# Patient Record
Sex: Male | Born: 1995 | Race: Black or African American | Hispanic: No | Marital: Single | Smoking: Current every day smoker
Health system: Southern US, Community
[De-identification: ages and names within clinical notes are randomized; demographics above are authoritative.]

## PROBLEM LIST (undated history)

## (undated) HISTORY — PX: TONSILLECTOMY: SUR1361

## (undated) HISTORY — PX: ADENOIDECTOMY: SUR15

---
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
## 2016-03-29 IMAGING — CR DG CHEST 2V
2 series · 2 of 2 positions shown · non-contrast
Comparison: None

CLINICAL DATA: Cough, congestion and chills.

EXAM:
CHEST - 2 VIEW

[chest pa]
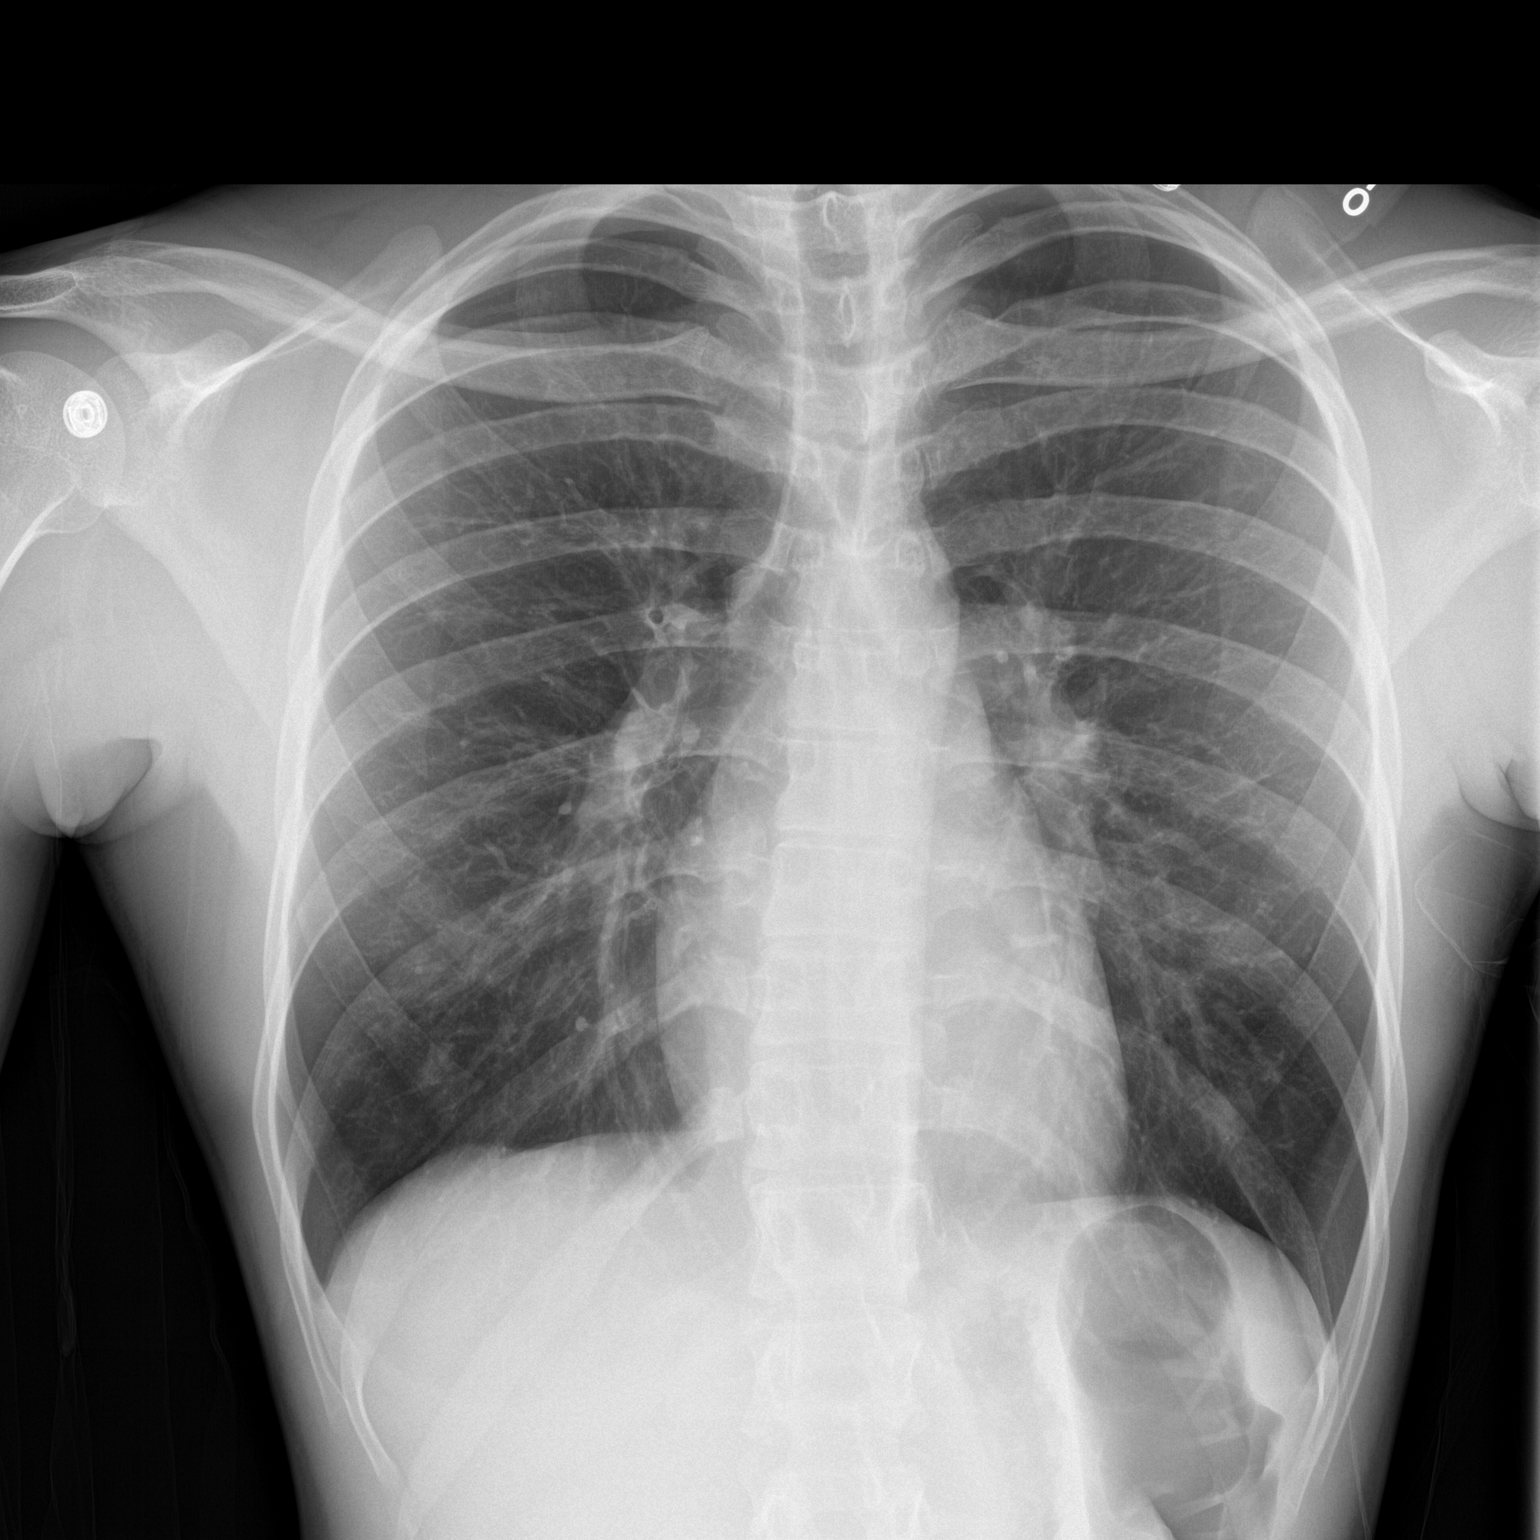

[chest lat]
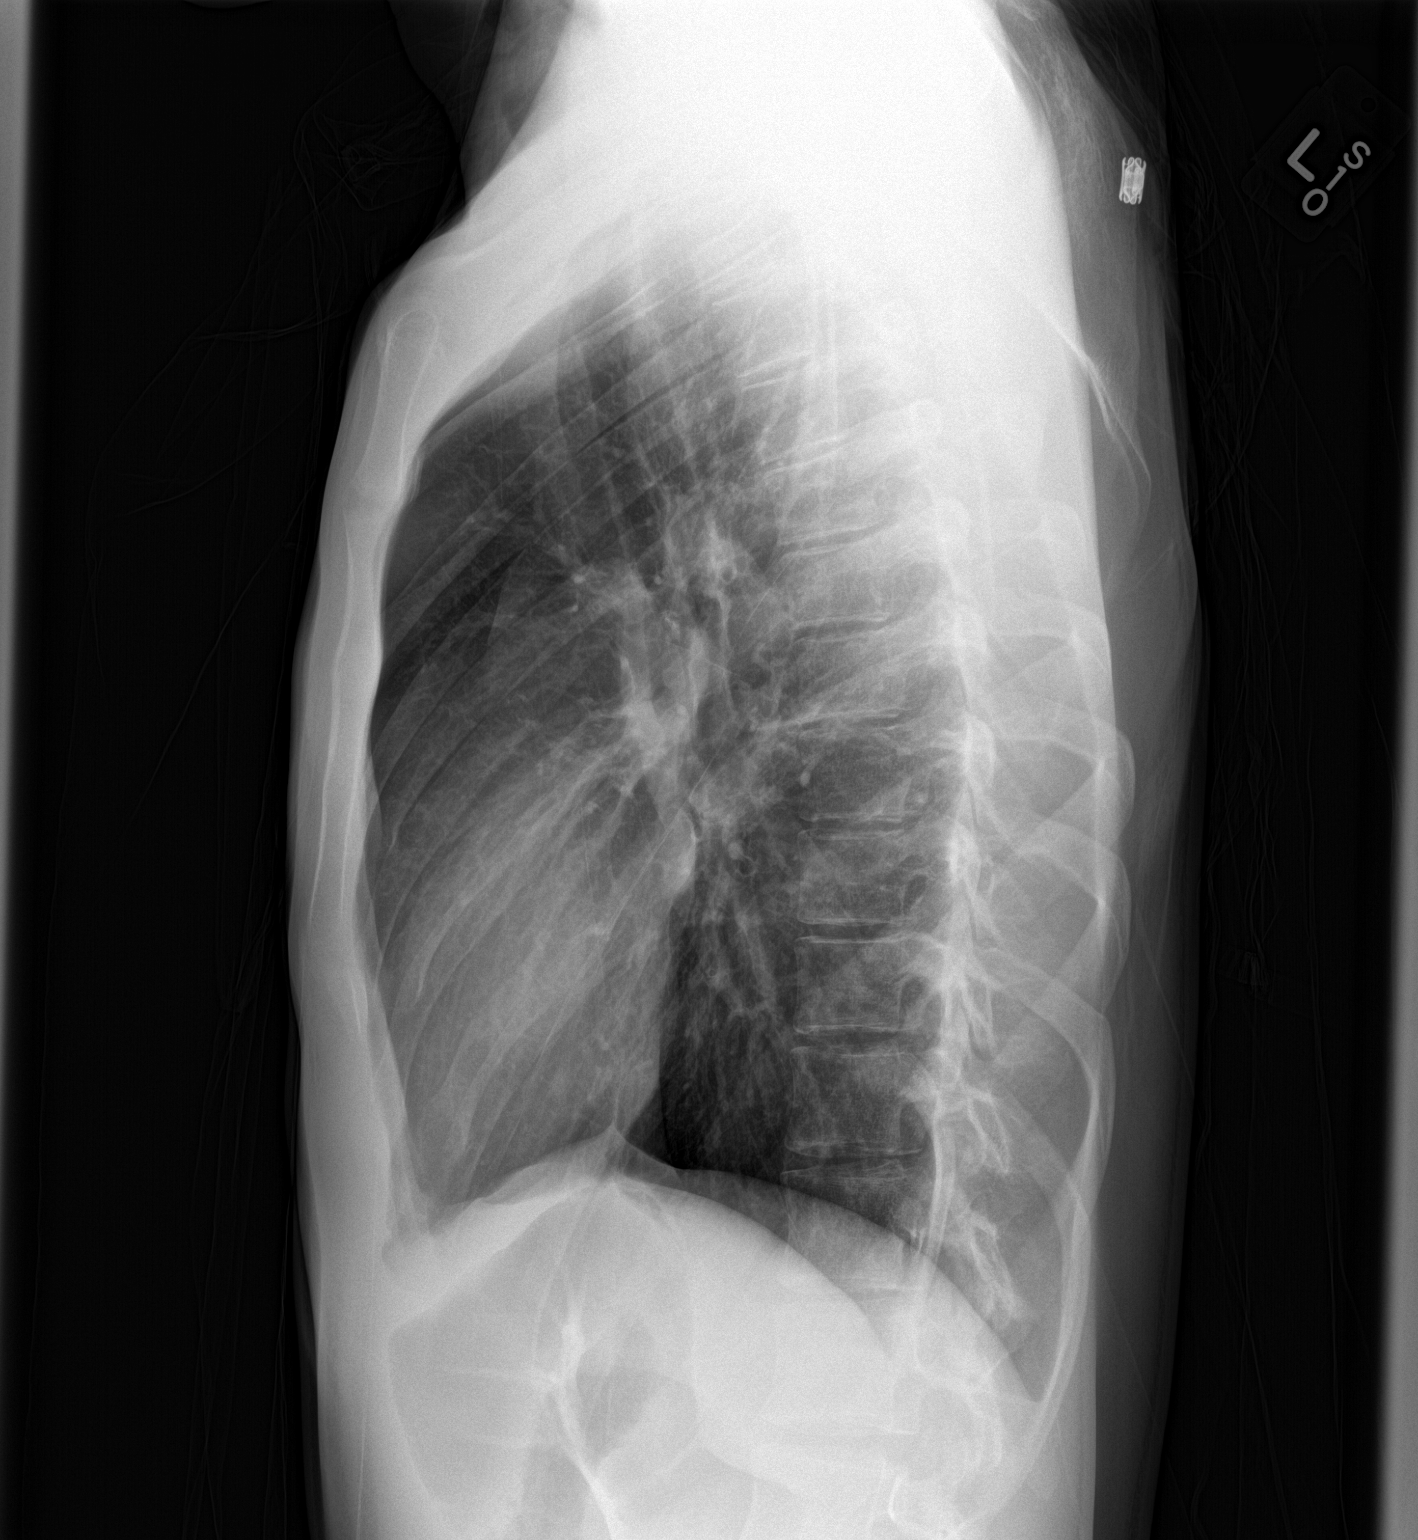

[2 of 2 positions shown; findings below may reference images not displayed]

FINDINGS: The heart size and mediastinal contours are within normal limits.
There is no evidence of pulmonary edema, consolidation,
pneumothorax, nodule or pleural fluid. The visualized skeletal
structures are unremarkable.
IMPRESSION: No active disease.

## 2016-11-03 ENCOUNTER — Emergency Department (HOSPITAL_COMMUNITY)
Admission: EM | Admit: 2016-11-03 | Discharge: 2016-11-03 | Disposition: A | Discharge status: Home / Self Care | Payer: 59 | Attending: Emergency Medicine | Admitting: Emergency Medicine

## 2016-11-03 ENCOUNTER — Encounter (HOSPITAL_COMMUNITY): Payer: Self-pay | Admitting: Emergency Medicine

## 2016-11-03 DIAGNOSIS — K0889 Other specified disorders of teeth and supporting structures: Secondary | ICD-10-CM | POA: Diagnosis present

## 2016-11-03 DIAGNOSIS — K029 Dental caries, unspecified: Secondary | ICD-10-CM | POA: Diagnosis not present

## 2016-11-03 DIAGNOSIS — F17200 Nicotine dependence, unspecified, uncomplicated: Secondary | ICD-10-CM | POA: Diagnosis not present

## 2016-11-03 DIAGNOSIS — Z79899 Other long term (current) drug therapy: Secondary | ICD-10-CM | POA: Insufficient documentation

## 2016-11-03 MED ORDER — PENICILLIN V POTASSIUM 250 MG PO TABS
500.0000 mg | ORAL_TABLET | Freq: Once | ORAL | Status: AC
  Administered 2016-11-03: 500 mg via ORAL
  Filled 2016-11-03: qty 2

## 2016-11-03 MED ORDER — ACETAMINOPHEN 325 MG PO TABS
650.0000 mg | ORAL_TABLET | Freq: Once | ORAL | Status: AC
  Administered 2016-11-03: 650 mg via ORAL
  Filled 2016-11-03: qty 2

## 2016-11-03 MED ORDER — IBUPROFEN 800 MG PO TABS: 800.0000 mg | ORAL_TABLET | Freq: Three times a day (TID) | ORAL | 0 refills | Status: AC

## 2016-11-03 MED ORDER — PENICILLIN V POTASSIUM 500 MG PO TABS: 500.0000 mg | ORAL_TABLET | Freq: Three times a day (TID) | ORAL | 0 refills | Status: AC

## 2016-11-03 NOTE — Discharge Instructions (Signed)
Please read and follow all provided instructions.  Your diagnoses today include:  1. Toothache     The exam and treatment you received today has been provided on an emergency basis only. This is not a substitute for complete medical or dental care.  Tests performed today include:  Vital signs. See below for your results today.   Medications prescribed:   Penicillin - antibiotic  You have been prescribed an antibiotic medicine: take the entire course of medicine even if you are feeling better. Stopping early can cause the antibiotic not to work.   Ibuprofen (Motrin, Advil) - anti-inflammatory pain medication  Do not exceed  ibuprofen every 6 hours, take with food  You have been prescribed an anti-inflammatory medication or NSAID. Take with food. Take smallest effective dose for the shortest duration needed for your pain. Stop taking if you experience stomach pain or vomiting.   Take any prescribed medications only as directed.  Home care instructions:  Follow any educational materials contained in this packet.  Follow-up instructions: Please follow-up with your dentist for further evaluation of your symptoms.   Dental Assistance: See attached dental referrals  Return instructions:   Please return to the Emergency Department if you experience worsening symptoms.  Please return if you develop a fever, you develop more swelling in your face or neck, you have trouble breathing or swallowing food.  Please return if you have any other emergent concerns.  Additional Information:  Your vital signs today were: BP 111/67 (BP Location: Left Arm)    Pulse 65    Temp 98.2 F (36.8 C) (Oral)    Resp 14    Ht  (1.753 m)    Wt 63.5 kg    SpO2 98%    BMI 20.67 kg/m  If your blood pressure (BP) was elevated above 135/85 this visit, please have this repeated by your doctor within one month. --------------

## 2016-11-03 NOTE — ED Provider Notes (Signed)
[facility]-EMERGENCY DEPT Provider Note   CSN: [id number] Arrival date & time: 11/03/16  2216     History   Chief Complaint Chief Complaint  Patient presents with  . Dental Pain    HPI Jared Howell is a 21 y.o. male.  Patient presents with complaint of dental pain ongoing intermittently for the past several months, worsening today. Patient describes having pain in her left lower molar where he has a large cavity. Wisdom teeth have not yet erupted. He has reported some subjective swelling to left lower jaw. No neck swelling, difficulty breathing, or swallowing. He has been taking Aleve with some relief. No fevers. Patient does not currently have a dentist.      History reviewed. No pertinent past medical history.  There are no active problems to display for this patient.   History reviewed. No pertinent surgical history.     Home Medications    Prior to Admission medications   Medication Sig Start Date End Date Taking? Authorizing Provider  azithromycin (ZITHROMAX) 250 MG tablet Take 1 tablet (250 mg total) by mouth daily. Take first 2 tablets together, then 1 every day until finished. 09/27/14   Elson Areas, PA-C  ibuprofen (ADVIL,MOTRIN) 800 MG tablet Take 1 tablet (800 mg total) by mouth 3 (three) times daily. 11/03/16   Renne Crigler, PA-C  ipratropium (ATROVENT) 0.06 % nasal spray Place 2 sprays into both nostrils 4 (four) times daily. 09/25/14   Rodolph Bong, MD  naproxen (NAPROSYN) 375 MG tablet Take 1 tablet (375 mg total) by mouth 2 (two) times daily. 09/25/14   Rodolph Bong, MD  omeprazole (PRILOSEC) 40 MG capsule Take 1 capsule (40 mg total) by mouth daily. 09/25/14   Rodolph Bong, MD  penicillin v potassium (VEETID) 500 MG tablet Take 1 tablet (500 mg total) by mouth 3 (three) times daily. 11/03/16   Renne Crigler, PA-C    Family History No family history on file.  Social History Social History  Substance Use Topics  . Smoking status: Current Every Day Smoker  .  Smokeless tobacco: Never Used  . Alcohol use No     Allergies   Patient has no known allergies.   Review of Systems Review of Systems  Constitutional: Negative for fever.  HENT: Positive for dental problem and facial swelling. Negative for ear pain, sore throat and trouble swallowing.   Respiratory: Negative for shortness of breath and stridor.   Musculoskeletal: Negative for neck pain.  Skin: Negative for color change.  Neurological: Negative for headaches.     Physical Exam Updated Vital Signs BP 113/79 (BP Location: Right Arm)   Pulse 61   Temp 98.2 F (36.8 C) (Oral)   Resp 18   Ht  (1.753 m)   Wt 63.5 kg   SpO2 98%   BMI 20.67 kg/m   Physical Exam  Constitutional: He appears well-developed and well-nourished.  HENT:  Head: Normocephalic and atraumatic.  Right Ear: Tympanic membrane, external ear and ear canal normal.  Left Ear: Tympanic membrane, external ear and ear canal normal.  Nose: Nose normal.  Mouth/Throat: Uvula is midline, oropharynx is clear and moist and mucous membranes are normal. No trismus in the jaw. Abnormal dentition. Dental caries present. No dental abscesses or uvula swelling. No tonsillar abscesses.  Tooth 15 with a large cavity. Patient with some gingival tenderness in this area. No palpable or first abscess noted. No swelling or erythema noted on exam.  Eyes: Pupils are equal, round,  and reactive to light.  Neck: Normal range of motion. Neck supple.  No neck swelling or Lugwig's angina  Neurological: He is alert.  Skin: Skin is warm and dry.  Psychiatric: He has a normal mood and affect.  Nursing note and vitals reviewed.    ED Treatments / Results   Procedures Procedures (including critical care time)  Medications Ordered in ED Medications  penicillin v potassium (VEETID) tablet 500 mg (500 mg Oral Given 11/03/16 2336)  acetaminophen (TYLENOL) tablet 650 mg (650 mg Oral Given 11/03/16 2336)     Initial Impression /  Assessment and Plan / ED Course  I have reviewed the triage vital signs and the nursing notes.  Pertinent labs & imaging results that were available during my care of the patient were reviewed by me and considered in my medical decision making (see chart for details).     11:54 PM Patient seen and examined. Medications ordered.   Vital signs reviewed and are as follows: BP 113/79 (BP Location: Right Arm)   Pulse 61   Temp 98.2 F (36.8 C) (Oral)   Resp 18   Ht  (1.753 m)   Wt 63.5 kg   SpO2 98%   BMI 20.67 kg/m   Patient counseled to take prescribed medications as directed, return with worsening facial or neck swelling, and to follow-up with their dentist as soon as possible.    Final Clinical Impressions(s) / ED Diagnoses   Final diagnoses:  Toothache   Patient with toothache. No fever. Exam unconcerning for Ludwig's angina or other deep tissue infection in neck.   As there is gum swelling and increasing pain will treat with antibiotic and pain medicine. Urged patient to follow-up with dentist. Referrals given.   New Prescriptions Discharge Medication List as of 11/03/2016 11:27 PM    START taking these medications   Details  ibuprofen (ADVIL,MOTRIN) 800 MG tablet Take 1 tablet (800 mg total) by mouth 3 (three) times daily., Starting Sun 11/03/2016, Print    penicillin v potassium (VEETID) 500 MG tablet Take 1 tablet (500 mg total) by mouth 3 (three) times daily., Starting Sun 11/03/2016, Print         Renne Crigler, PA-C 11/03/16 2355    Samuel Jester, DO 11/05/16 2007

## 2016-11-03 NOTE — ED Notes (Signed)
Keyboard in room not working

## 2016-11-03 NOTE — ED Triage Notes (Signed)
C/o L sided upper and lower dental pain x 1 month.  Rates pain 10/10.  Pt eating a Rice Krispie treat in triage.

## 2017-03-28 DIAGNOSIS — F902 Attention-deficit hyperactivity disorder, combined type: Secondary | ICD-10-CM | POA: Insufficient documentation

## 2017-06-03 ENCOUNTER — Other Ambulatory Visit (INDEPENDENT_AMBULATORY_CARE_PROVIDER_SITE_OTHER): Payer: Self-pay | Admitting: Family Medicine

## 2017-10-22 ENCOUNTER — Other Ambulatory Visit (INDEPENDENT_AMBULATORY_CARE_PROVIDER_SITE_OTHER): Payer: Self-pay | Admitting: Family Medicine

## 2018-04-30 ENCOUNTER — Other Ambulatory Visit: Payer: No Typology Code available for payment source

## 2018-04-30 ENCOUNTER — Inpatient Hospital Stay: Payer: No Typology Code available for payment source | Admitting: Certified Registered"

## 2018-04-30 ENCOUNTER — Emergency Department: Payer: No Typology Code available for payment source

## 2018-04-30 ENCOUNTER — Inpatient Hospital Stay
Admission: AC | Admit: 2018-04-30 | Discharge: 2018-05-19 | DRG: 326 | Payer: No Typology Code available for payment source | Attending: Surgery | Admitting: Surgery

## 2018-04-30 ENCOUNTER — Encounter: Admission: EM | Payer: Self-pay | Source: Home / Self Care | Attending: Surgery

## 2018-04-30 ENCOUNTER — Inpatient Hospital Stay: Payer: No Typology Code available for payment source

## 2018-04-30 DIAGNOSIS — N3289 Other specified disorders of bladder: Secondary | ICD-10-CM | POA: Diagnosis not present

## 2018-04-30 DIAGNOSIS — S36220A Contusion of head of pancreas, initial encounter: Secondary | ICD-10-CM | POA: Diagnosis present

## 2018-04-30 DIAGNOSIS — S36113A Laceration of liver, unspecified degree, initial encounter: Secondary | ICD-10-CM

## 2018-04-30 DIAGNOSIS — S12500A Unspecified displaced fracture of sixth cervical vertebra, initial encounter for closed fracture: Secondary | ICD-10-CM | POA: Diagnosis present

## 2018-04-30 DIAGNOSIS — S51851A Open bite of right forearm, initial encounter: Secondary | ICD-10-CM | POA: Diagnosis present

## 2018-04-30 DIAGNOSIS — S36116A Major laceration of liver, initial encounter: Secondary | ICD-10-CM | POA: Diagnosis present

## 2018-04-30 DIAGNOSIS — W540XXA Bitten by dog, initial encounter: Secondary | ICD-10-CM

## 2018-04-30 DIAGNOSIS — G8911 Acute pain due to trauma: Secondary | ICD-10-CM | POA: Diagnosis present

## 2018-04-30 DIAGNOSIS — Z5331 Laparoscopic surgical procedure converted to open procedure: Secondary | ICD-10-CM

## 2018-04-30 DIAGNOSIS — S12591A Other nondisplaced fracture of sixth cervical vertebra, initial encounter for closed fracture: Secondary | ICD-10-CM

## 2018-04-30 DIAGNOSIS — S32019A Unspecified fracture of first lumbar vertebra, initial encounter for closed fracture: Secondary | ICD-10-CM | POA: Diagnosis present

## 2018-04-30 DIAGNOSIS — S22089A Unspecified fracture of T11-T12 vertebra, initial encounter for closed fracture: Secondary | ICD-10-CM | POA: Diagnosis present

## 2018-04-30 DIAGNOSIS — K660 Peritoneal adhesions (postprocedural) (postinfection): Secondary | ICD-10-CM | POA: Diagnosis present

## 2018-04-30 DIAGNOSIS — S36430A Laceration of duodenum, initial encounter: Principal | ICD-10-CM | POA: Diagnosis present

## 2018-04-30 DIAGNOSIS — S36899A Unspecified injury of other intra-abdominal organs, initial encounter: Secondary | ICD-10-CM | POA: Diagnosis present

## 2018-04-30 DIAGNOSIS — S32010A Wedge compression fracture of first lumbar vertebra, initial encounter for closed fracture: Secondary | ICD-10-CM

## 2018-04-30 DIAGNOSIS — Q438 Other specified congenital malformations of intestine: Secondary | ICD-10-CM

## 2018-04-30 DIAGNOSIS — S32029A Unspecified fracture of second lumbar vertebra, initial encounter for closed fracture: Secondary | ICD-10-CM | POA: Diagnosis present

## 2018-04-30 DIAGNOSIS — S22080A Wedge compression fracture of T11-T12 vertebra, initial encounter for closed fracture: Secondary | ICD-10-CM

## 2018-04-30 DIAGNOSIS — E875 Hyperkalemia: Secondary | ICD-10-CM | POA: Diagnosis not present

## 2018-04-30 DIAGNOSIS — S32009A Unspecified fracture of unspecified lumbar vertebra, initial encounter for closed fracture: Secondary | ICD-10-CM | POA: Diagnosis present

## 2018-04-30 DIAGNOSIS — E46 Unspecified protein-calorie malnutrition: Secondary | ICD-10-CM

## 2018-04-30 DIAGNOSIS — S81011A Laceration without foreign body, right knee, initial encounter: Secondary | ICD-10-CM | POA: Diagnosis present

## 2018-04-30 DIAGNOSIS — S36892A Contusion of other intra-abdominal organs, initial encounter: Secondary | ICD-10-CM | POA: Diagnosis present

## 2018-04-30 DIAGNOSIS — E162 Hypoglycemia, unspecified: Secondary | ICD-10-CM | POA: Diagnosis not present

## 2018-04-30 DIAGNOSIS — S32020A Wedge compression fracture of second lumbar vertebra, initial encounter for closed fracture: Secondary | ICD-10-CM

## 2018-04-30 DIAGNOSIS — D649 Anemia, unspecified: Secondary | ICD-10-CM | POA: Diagnosis not present

## 2018-04-30 HISTORY — PX: LAPAROTOMY, ABDOMINAL EXPLORATION: SHX4587

## 2018-04-30 HISTORY — PX: CLOSURE, WOUND: SHX3459

## 2018-04-30 HISTORY — PX: EXPLORATORY LAPAROSCOPY: SHX4078

## 2018-04-30 LAB — COMPREHENSIVE METABOLIC PANEL
ALT: 126 U/L — ABNORMAL HIGH (ref 0–55)
AST (SGOT): 148 U/L — ABNORMAL HIGH (ref 5–34)
Albumin/Globulin Ratio: 1.5 (ref 0.9–2.2)
Albumin: 3.7 g/dL (ref 3.5–5.0)
Alkaline Phosphatase: 60 U/L (ref 38–106)
BUN: 19 mg/dL (ref 9.0–28.0)
Bilirubin, Total: 0.9 mg/dL (ref 0.2–1.2)
CO2: 19 mEq/L — ABNORMAL LOW (ref 22–29)
Calcium: 8.9 mg/dL (ref 8.5–10.5)
Chloride: 104 mEq/L (ref 100–111)
Creatinine: 1.3 mg/dL (ref 0.7–1.3)
Globulin: 2.4 g/dL (ref 2.0–3.6)
Glucose: 100 mg/dL (ref 70–100)
Potassium: 3.5 mEq/L (ref 3.5–5.1)
Protein, Total: 6.1 g/dL (ref 6.0–8.3)
Sodium: 136 mEq/L (ref 136–145)

## 2018-04-30 LAB — CBC
Absolute NRBC: 0 10*3/uL (ref 0.00–0.00)
Hematocrit: 23.3 % — ABNORMAL LOW (ref 37.6–49.6)
Hgb: 7.4 g/dL — ABNORMAL LOW (ref 12.5–17.1)
MCH: 29.2 pg (ref 25.1–33.5)
MCHC: 31.8 g/dL (ref 31.5–35.8)
MCV: 92.1 fL (ref 78.0–96.0)
MPV: 10.6 fL (ref 8.9–12.5)
Nucleated RBC: 0 /100 WBC (ref 0.0–0.0)
Platelets: 145 10*3/uL (ref 142–346)
RBC: 2.53 10*6/uL — ABNORMAL LOW (ref 4.20–5.90)
RDW: 13 % (ref 11–15)
WBC: 4.45 10*3/uL (ref 3.10–9.50)

## 2018-04-30 LAB — PREPARE FRESH FROZEN PLASMA
Expiration Date: 201909290547
Expiration Date: 201909301449
ISBT CODE: 6200
ISBT CODE: 8400
Status: TRANSFUSED
Status: TRANSFUSED
UTYPE: A POS
UTYPE: AB POS

## 2018-04-30 LAB — PT/INR
PT INR: 1.9 — ABNORMAL HIGH (ref 0.9–1.1)
PT: 21.7 s — ABNORMAL HIGH (ref 12.6–15.0)

## 2018-04-30 LAB — PT AND APTT
PT INR: 1.3 — ABNORMAL HIGH (ref 0.9–1.1)
PT: 15.8 s — ABNORMAL HIGH (ref 12.6–15.0)
PTT: 30 s (ref 23–37)

## 2018-04-30 LAB — BASIC METABOLIC PANEL
BUN: 12 mg/dL (ref 9.0–28.0)
CO2: 23 mEq/L (ref 22–29)
Calcium: 7.5 mg/dL — ABNORMAL LOW (ref 7.9–10.2)
Chloride: 106 mEq/L (ref 100–111)
Creatinine: 0.9 mg/dL (ref 0.7–1.3)
Glucose: 103 mg/dL — ABNORMAL HIGH (ref 70–100)
Potassium: 4.2 mEq/L (ref 3.5–5.1)
Sodium: 138 mEq/L (ref 136–145)

## 2018-04-30 LAB — CBC AND DIFFERENTIAL
Absolute NRBC: 0 10*3/uL (ref 0.00–0.00)
Basophils Absolute Automated: 0.01 10*3/uL (ref 0.00–0.08)
Basophils Automated: 0.1 %
Eosinophils Absolute Automated: 0 10*3/uL (ref 0.00–0.44)
Eosinophils Automated: 0 %
Hematocrit: 35.4 % — ABNORMAL LOW (ref 37.6–49.6)
Hgb: 11.8 g/dL — ABNORMAL LOW (ref 12.5–17.1)
Immature Granulocytes Absolute: 0.08 10*3/uL — ABNORMAL HIGH (ref 0.00–0.07)
Immature Granulocytes: 0.7 %
Lymphocytes Absolute Automated: 0.57 10*3/uL (ref 0.42–3.22)
Lymphocytes Automated: 4.8 %
MCH: 29.7 pg (ref 25.1–33.5)
MCHC: 33.3 g/dL (ref 31.5–35.8)
MCV: 89.2 fL (ref 78.0–96.0)
MPV: 11.1 fL (ref 8.9–12.5)
Monocytes Absolute Automated: 0.79 10*3/uL (ref 0.21–0.85)
Monocytes: 6.7 %
Neutrophils Absolute: 10.35 10*3/uL — ABNORMAL HIGH (ref 1.10–6.33)
Neutrophils: 87.7 %
Nucleated RBC: 0 /100 WBC (ref 0.0–0.0)
Platelets: 262 10*3/uL (ref 142–346)
RBC: 3.97 10*6/uL — ABNORMAL LOW (ref 4.20–5.90)
RDW: 12 % (ref 11–15)
WBC: 11.8 10*3/uL — ABNORMAL HIGH (ref 3.10–9.50)

## 2018-04-30 LAB — HEPATIC FUNCTION PANEL
ALT: 59 U/L — ABNORMAL HIGH (ref 0–55)
AST (SGOT): 99 U/L — ABNORMAL HIGH (ref 5–34)
Albumin/Globulin Ratio: 2.1 (ref 0.9–2.2)
Albumin: 3 g/dL — ABNORMAL LOW (ref 3.5–5.0)
Alkaline Phosphatase: 28 U/L — ABNORMAL LOW (ref 38–106)
Bilirubin Direct: 0.3 mg/dL (ref 0.0–0.5)
Bilirubin Indirect: 2.2 mg/dL — ABNORMAL HIGH (ref 0.2–1.0)
Bilirubin, Total: 2.5 mg/dL — ABNORMAL HIGH (ref 0.2–1.2)
Globulin: 1.4 g/dL — ABNORMAL LOW (ref 2.0–3.6)
Protein, Total: 4.4 g/dL — ABNORMAL LOW (ref 6.0–8.3)

## 2018-04-30 LAB — TYPE AND SCREEN
AB Screen Gel: NEGATIVE
ABO Rh: O POS

## 2018-04-30 LAB — MAGNESIUM: Magnesium: 1.2 mg/dL — ABNORMAL LOW (ref 1.6–2.6)

## 2018-04-30 LAB — HEMOGLOBIN AND HEMATOCRIT, BLOOD
Hematocrit: 38.8 % (ref 37.6–49.6)
Hgb: 13 g/dL (ref 12.5–17.1)

## 2018-04-30 LAB — GFR
EGFR: >60
EGFR: >60

## 2018-04-30 LAB — ETHANOL: Alcohol: NOT DETECTED mg/dL

## 2018-04-30 SURGERY — EXPLORATORY LAPAROSCOPY
Anesthesia: Anesthesia General | Site: Knee | Laterality: Right | Wound class: Contaminated

## 2018-04-30 MED ORDER — SODIUM CHLORIDE 0.9 % IV MBP
3.00 g | Freq: Four times a day (QID) | INTRAVENOUS | Status: DC
Start: 2018-04-30 — End: 2018-04-30
  Administered 2018-04-30 (×2): 3 g via INTRAVENOUS

## 2018-04-30 MED ORDER — HYDROMORPHONE HCL 1 MG/ML IJ SOLN
INTRAMUSCULAR | Status: AC
Start: 2018-04-30 — End: ?
  Filled 2018-04-30: qty 1

## 2018-04-30 MED ORDER — POTASSIUM CHLORIDE 10 MEQ/100ML IV SOLN (WRAP)
10.00 meq | INTRAVENOUS | Status: DC | PRN
Start: 2018-04-30 — End: 2018-05-05

## 2018-04-30 MED ORDER — LIDOCAINE HCL 2 % IJ SOLN
INTRAMUSCULAR | Status: DC | PRN
Start: 2018-04-30 — End: 2018-04-30
  Administered 2018-04-30: 50 mg

## 2018-04-30 MED ORDER — LIDOCAINE HCL 1 % IJ SOLN
INTRAMUSCULAR | Status: AC
Start: 2018-04-30 — End: 2018-04-30
  Filled 2018-04-30: qty 50

## 2018-04-30 MED ORDER — GABAPENTIN 50 MG/ML UNIT DOSE
200.00 mg | Freq: Three times a day (TID) | ORAL | Status: DC
Start: 2018-04-30 — End: 2018-04-30

## 2018-04-30 MED ORDER — SODIUM CHLORIDE 0.9 % IR SOLN
Status: DC | PRN
Start: 2018-04-30 — End: 2018-04-30
  Administered 2018-04-30: 1000 mL
  Administered 2018-04-30 (×4): 2000 mL

## 2018-04-30 MED ORDER — SODIUM CHLORIDE 0.9 % IV SOLN
INTRAVENOUS | Status: DC | PRN
Start: 2018-04-30 — End: 2018-05-02

## 2018-04-30 MED ORDER — FENTANYL CITRATE (PF) 50 MCG/ML IJ SOLN (WRAP)
INTRAMUSCULAR | Status: DC | PRN
Start: 2018-04-30 — End: 2018-04-30
  Administered 2018-04-30 (×2): 50 ug via INTRAVENOUS
  Administered 2018-04-30: 100 ug via INTRAVENOUS

## 2018-04-30 MED ORDER — ONDANSETRON HCL 4 MG/2ML IJ SOLN
4.00 mg | Freq: Three times a day (TID) | INTRAMUSCULAR | Status: DC | PRN
Start: 2018-04-30 — End: 2018-04-30
  Administered 2018-04-30: 04:00:00 4 mg via INTRAVENOUS

## 2018-04-30 MED ORDER — SENNOSIDES-DOCUSATE SODIUM 8.6-50 MG PO TABS
1.00 | ORAL_TABLET | Freq: Every evening | ORAL | Status: DC
Start: 2018-04-30 — End: 2018-04-30

## 2018-04-30 MED ORDER — DEXTROSE 5 % IV SOLN
15.00 mmol | INTRAVENOUS | Status: DC | PRN
Start: 2018-04-30 — End: 2018-05-05

## 2018-04-30 MED ORDER — PHENYLEPHRINE HCL 10 MG/ML IV SOLN (WRAP)
Status: DC | PRN
Start: 2018-04-30 — End: 2018-04-30
  Administered 2018-04-30: 15:00:00 30 ug/min via INTRAVENOUS

## 2018-04-30 MED ORDER — MIDAZOLAM HCL 2 MG/2ML IJ SOLN
INTRAMUSCULAR | Status: AC
Start: 2018-04-30 — End: ?
  Filled 2018-04-30: qty 2

## 2018-04-30 MED ORDER — ONDANSETRON HCL 4 MG/2ML IJ SOLN
INTRAMUSCULAR | Status: AC
Start: 2018-04-30 — End: ?
  Filled 2018-04-30: qty 2

## 2018-04-30 MED ORDER — LACTATED RINGERS IV BOLUS
1000.0000 mL | Freq: Once | INTRAVENOUS | Status: AC
Start: 2018-04-30 — End: 2018-04-30
  Administered 2018-04-30: 22:00:00 1000 mL via INTRAVENOUS

## 2018-04-30 MED ORDER — HYDROMORPHONE HCL 0.5 MG/0.5 ML IJ SOLN
0.50 mg | INTRAMUSCULAR | Status: DC | PRN
Start: 2018-04-30 — End: 2018-05-08
  Administered 2018-04-30 – 2018-05-08 (×49): 0.5 mg via INTRAVENOUS
  Filled 2018-04-30 (×51): qty 1

## 2018-04-30 MED ORDER — BUPIVACAINE HCL (PF) 0.5 % IJ SOLN
INTRAMUSCULAR | Status: AC
Start: 2018-04-30 — End: 2018-04-30
  Filled 2018-04-30: qty 30

## 2018-04-30 MED ORDER — CEFAZOLIN SODIUM-DEXTROSE 2-3 GM-%(50ML) IV SOLR
INTRAVENOUS | Status: AC
Start: 2018-04-30 — End: 2018-04-30
  Filled 2018-04-30: qty 50

## 2018-04-30 MED ORDER — NEOSTIGMINE METHYLSULFATE 1 MG/ML IJ/IV SOLN (WRAP)
Status: DC | PRN
Start: 2018-04-30 — End: 2018-04-30
  Administered 2018-04-30: 3 mg via INTRAVENOUS

## 2018-04-30 MED ORDER — PROPOFOL 10 MG/ML IV EMUL (WRAP)
INTRAVENOUS | Status: DC | PRN
Start: 2018-04-30 — End: 2018-04-30
  Administered 2018-04-30: 200 mg via INTRAVENOUS

## 2018-04-30 MED ORDER — LIDOCAINE HCL 1 % IJ SOLN
INTRAMUSCULAR | Status: DC | PRN
Start: 2018-04-30 — End: 2018-04-30
  Administered 2018-04-30: 10 mL

## 2018-04-30 MED ORDER — HYDROMORPHONE HCL 1 MG/ML IJ SOLN
INTRAMUSCULAR | Status: DC | PRN
Start: 2018-04-30 — End: 2018-04-30
  Administered 2018-04-30: 1 mg via INTRAVENOUS
  Administered 2018-04-30: .3 mg via INTRAVENOUS
  Administered 2018-04-30: .7 mg via INTRAVENOUS

## 2018-04-30 MED ORDER — HYDROMORPHONE HCL 0.5 MG/0.5 ML IJ SOLN
0.5000 mg | INTRAMUSCULAR | Status: DC | PRN
Start: 2018-04-30 — End: 2018-04-30

## 2018-04-30 MED ORDER — LACTATED RINGERS IV SOLN
INTRAVENOUS | Status: DC
Start: 2018-04-30 — End: 2018-04-30

## 2018-04-30 MED ORDER — HYDROMORPHONE HCL 0.5 MG/0.5 ML IJ SOLN
0.20 mg | Freq: Once | INTRAMUSCULAR | Status: AC
Start: 2018-04-30 — End: 2018-04-30
  Administered 2018-04-30: 08:00:00 0.2 mg via INTRAVENOUS
  Filled 2018-04-30: qty 1

## 2018-04-30 MED ORDER — FAMOTIDINE 10 MG/ML IV SOLN (WRAP)
INTRAVENOUS | Status: DC | PRN
Start: 2018-04-30 — End: 2018-04-30
  Administered 2018-04-30: 20 mg via INTRAVENOUS

## 2018-04-30 MED ORDER — MAGNESIUM SULFATE IN D5W 1-5 GM/100ML-% IV SOLN
1.00 g | INTRAVENOUS | Status: DC | PRN
Start: 2018-04-30 — End: 2018-05-05
  Administered 2018-05-01 (×3): 1 g via INTRAVENOUS
  Filled 2018-04-30: qty 300

## 2018-04-30 MED ORDER — BACITRACIN 50000 UNITS IM SOLR
INTRAMUSCULAR | Status: AC
Start: 2018-04-30 — End: 2018-04-30
  Filled 2018-04-30: qty 50000

## 2018-04-30 MED ORDER — LACTATED RINGERS IV SOLN
INTRAVENOUS | Status: DC
Start: 2018-04-30 — End: 2018-05-01

## 2018-04-30 MED ORDER — DEXTROSE 5 % IV SOLN
35.00 mmol | INTRAVENOUS | Status: DC | PRN
Start: 2018-04-30 — End: 2018-05-05

## 2018-04-30 MED ORDER — SODIUM CHLORIDE 0.9 % IV MBP
4.50 g | Freq: Four times a day (QID) | INTRAVENOUS | Status: AC
Start: 2018-04-30 — End: 2018-05-04
  Administered 2018-04-30 – 2018-05-04 (×16): 4.5 g via INTRAVENOUS
  Filled 2018-04-30 (×15): qty 20

## 2018-04-30 MED ORDER — DEXTROSE 5 % IV SOLN
25.00 mmol | INTRAVENOUS | Status: DC | PRN
Start: 2018-04-30 — End: 2018-05-05

## 2018-04-30 MED ORDER — FENTANYL CITRATE (PF) 50 MCG/ML IJ SOLN (WRAP)
INTRAMUSCULAR | Status: AC
Start: 2018-04-30 — End: 2018-04-30
  Administered 2018-04-30: 05:00:00 50 ug via INTRAVENOUS
  Filled 2018-04-30: qty 2

## 2018-04-30 MED ORDER — LACTATED RINGERS IV BOLUS
500.0000 mL | INTRAVENOUS | Status: DC | PRN
Start: 2018-04-30 — End: 2018-04-30
  Administered 2018-04-30: 19:00:00 500 mL via INTRAVENOUS

## 2018-04-30 MED ORDER — FLUCONAZOLE IN SODIUM CHLORIDE 400-0.9 MG/200ML-% IV SOLN
400.00 mg | INTRAVENOUS | Status: DC
Start: 2018-04-30 — End: 2018-05-04
  Administered 2018-04-30 – 2018-05-03 (×4): 400 mg via INTRAVENOUS
  Filled 2018-04-30 (×5): qty 200

## 2018-04-30 MED ORDER — METOCLOPRAMIDE HCL 5 MG/ML IJ SOLN
10.0000 mg | Freq: Once | INTRAMUSCULAR | Status: DC | PRN
Start: 2018-04-30 — End: 2018-04-30

## 2018-04-30 MED ORDER — NEOSTIGMINE METHYLSULFATE 1 MG/ML IJ/IV SOLN (WRAP)
Status: AC
Start: 2018-04-30 — End: ?
  Filled 2018-04-30: qty 5

## 2018-04-30 MED ORDER — FENTANYL CITRATE (PF) 50 MCG/ML IJ SOLN (WRAP)
25.0000 ug | INTRAMUSCULAR | Status: DC | PRN
Start: 2018-04-30 — End: 2018-04-30

## 2018-04-30 MED ORDER — LACTATED RINGERS IV SOLN
INTRAVENOUS | Status: DC
Start: 2018-04-30 — End: 2018-05-04

## 2018-04-30 MED ORDER — PROPOFOL 10 MG/ML IV EMUL (WRAP)
INTRAVENOUS | Status: AC
Start: 2018-04-30 — End: ?
  Filled 2018-04-30: qty 20

## 2018-04-30 MED ORDER — ACETAMINOPHEN 325 MG PO TABS
650.00 mg | ORAL_TABLET | Freq: Four times a day (QID) | ORAL | Status: DC | PRN
Start: 2018-04-30 — End: 2018-04-30
  Filled 2018-04-30: qty 2

## 2018-04-30 MED ORDER — NALOXONE HCL 0.4 MG/ML IJ SOLN (WRAP)
0.20 mg | INTRAMUSCULAR | Status: DC | PRN
Start: 2018-04-30 — End: 2018-05-19

## 2018-04-30 MED ORDER — ROCURONIUM BROMIDE 50 MG/5ML IV SOLN
INTRAVENOUS | Status: AC
Start: 2018-04-30 — End: ?
  Filled 2018-04-30: qty 5

## 2018-04-30 MED ORDER — PHENYLEPHRINE 100 MCG/ML IN NACL 0.9% IV SOSY
PREFILLED_SYRINGE | INTRAVENOUS | Status: DC | PRN
Start: 2018-04-30 — End: 2018-04-30
  Administered 2018-04-30: 100 ug via INTRAVENOUS
  Administered 2018-04-30 (×4): 200 ug via INTRAVENOUS
  Administered 2018-04-30 (×2): 100 ug via INTRAVENOUS
  Administered 2018-04-30 (×2): 200 ug via INTRAVENOUS

## 2018-04-30 MED ORDER — GLYCOPYRROLATE 0.2 MG/ML IJ SOLN
INTRAMUSCULAR | Status: DC | PRN
Start: 2018-04-30 — End: 2018-04-30
  Administered 2018-04-30: .5 mg via INTRAVENOUS

## 2018-04-30 MED ORDER — EPHEDRINE SULFATE 50 MG/ML IJ/IV SOLN (WRAP)
Status: AC
Start: 2018-04-30 — End: ?
  Filled 2018-04-30: qty 1

## 2018-04-30 MED ORDER — LACTATED RINGERS IV SOLN
INTRAVENOUS | Status: DC | PRN
Start: 2018-04-30 — End: 2018-04-30

## 2018-04-30 MED ORDER — ALBUMIN HUMAN 5 % IV SOLN
INTRAVENOUS | Status: DC | PRN
Start: 2018-04-30 — End: 2018-04-30

## 2018-04-30 MED ORDER — HYDRALAZINE HCL 20 MG/ML IJ SOLN
10.0000 mg | INTRAMUSCULAR | Status: DC | PRN
Start: 2018-04-30 — End: 2018-04-30

## 2018-04-30 MED ORDER — LIDOCAINE HCL (PF) 2 % IJ SOLN
INTRAMUSCULAR | Status: AC
Start: 2018-04-30 — End: ?
  Filled 2018-04-30: qty 5

## 2018-04-30 MED ORDER — ONDANSETRON HCL 4 MG/2ML IJ SOLN
INTRAMUSCULAR | Status: DC | PRN
Start: 2018-04-30 — End: 2018-04-30
  Administered 2018-04-30: 4 mg via INTRAVENOUS

## 2018-04-30 MED ORDER — PHENYLEPHRINE 100 MCG/ML IN NACL 0.9% IV SOSY
PREFILLED_SYRINGE | INTRAVENOUS | Status: AC
Start: 2018-04-30 — End: ?
  Filled 2018-04-30: qty 20

## 2018-04-30 MED ORDER — FENTANYL CITRATE (PF) 50 MCG/ML IJ SOLN (WRAP)
50.0000 ug | INTRAMUSCULAR | Status: DC | PRN
Start: 2018-04-30 — End: 2018-04-30

## 2018-04-30 MED ORDER — MEPERIDINE HCL 25 MG/ML IJ SOLN
25.0000 mg | Freq: Once | INTRAMUSCULAR | Status: DC
Start: 2018-04-30 — End: 2018-04-30

## 2018-04-30 MED ORDER — FAMOTIDINE 20 MG/2ML IV SOLN
INTRAVENOUS | Status: AC
Start: 2018-04-30 — End: ?
  Filled 2018-04-30: qty 2

## 2018-04-30 MED ORDER — GLYCOPYRROLATE 0.2 MG/ML IJ SOLN
INTRAMUSCULAR | Status: AC
Start: 2018-04-30 — End: ?
  Filled 2018-04-30: qty 1

## 2018-04-30 MED ORDER — GLYCOPYRROLATE 0.2 MG/ML IJ SOLN
INTRAMUSCULAR | Status: AC
Start: 2018-04-30 — End: ?
  Filled 2018-04-30: qty 3

## 2018-04-30 MED ORDER — HYDROMORPHONE HCL 1 MG/ML IJ SOLN
1.00 mg | INTRAMUSCULAR | Status: DC | PRN
Start: 2018-04-30 — End: 2018-04-30

## 2018-04-30 MED ORDER — ONDANSETRON HCL 4 MG/2ML IJ SOLN
4.0000 mg | Freq: Once | INTRAMUSCULAR | Status: DC | PRN
Start: 2018-04-30 — End: 2018-04-30

## 2018-04-30 MED ORDER — ACETAMINOPHEN 160 MG/5ML PO SOLN
1000.00 mg | Freq: Three times a day (TID) | ORAL | Status: DC
Start: 2018-04-30 — End: 2018-04-30

## 2018-04-30 MED ORDER — CALCIUM GLUCONATE 10 % IV SOLN
1.00 g | INTRAVENOUS | Status: DC | PRN
Start: 2018-04-30 — End: 2018-05-05

## 2018-04-30 MED ORDER — SODIUM CHLORIDE 0.9 % IV MBP
1.50 g | Freq: Four times a day (QID) | INTRAVENOUS | Status: DC
Start: 2018-04-30 — End: 2018-04-30

## 2018-04-30 MED ORDER — OXYCODONE HCL 5 MG PO TABS
5.00 mg | ORAL_TABLET | ORAL | Status: DC | PRN
Start: 2018-04-30 — End: 2018-04-30

## 2018-04-30 MED ORDER — LACTATED RINGERS IV BOLUS
1000.00 mL | Freq: Once | INTRAVENOUS | Status: AC
Start: 2018-04-30 — End: 2018-04-30
  Administered 2018-04-30: 19:00:00 1000 mL via INTRAVENOUS

## 2018-04-30 MED ORDER — HYDROMORPHONE HCL 0.5 MG/0.5 ML IJ SOLN
0.50 mg | INTRAMUSCULAR | Status: DC | PRN
Start: 2018-04-30 — End: 2018-04-30
  Administered 2018-04-30: 0.5 mg via INTRAVENOUS
  Filled 2018-04-30: qty 1

## 2018-04-30 MED ORDER — BUPIVACAINE HCL 0.5 % IJ SOLN
INTRAMUSCULAR | Status: DC | PRN
Start: 2018-04-30 — End: 2018-04-30
  Administered 2018-04-30: 10 mL

## 2018-04-30 MED ORDER — ROCURONIUM BROMIDE 10 MG/ML IV SOLN (WRAP)
INTRAVENOUS | Status: DC | PRN
Start: 2018-04-30 — End: 2018-04-30
  Administered 2018-04-30: 15 mg via INTRAVENOUS
  Administered 2018-04-30 (×2): 10 mg via INTRAVENOUS
  Administered 2018-04-30: 50 mg via INTRAVENOUS

## 2018-04-30 MED ORDER — IOHEXOL 350 MG/ML IV SOLN
100.00 mL | Freq: Once | INTRAVENOUS | Status: AC | PRN
Start: 2018-04-30 — End: 2018-04-30
  Administered 2018-04-30: 05:00:00 100 mL via INTRAVENOUS

## 2018-04-30 MED ORDER — LIDOCAINE HCL 1 % IJ SOLN
20.00 mL | Freq: Once | INTRAMUSCULAR | Status: DC
Start: 2018-04-30 — End: 2018-05-17

## 2018-04-30 MED ORDER — OXYCODONE HCL 5 MG PO TABS
5.0000 mg | ORAL_TABLET | Freq: Once | ORAL | Status: DC | PRN
Start: 2018-04-30 — End: 2018-04-30

## 2018-04-30 MED ORDER — BENZOCAINE 20% MT SOLN (WRAP)
1.00 | Freq: Three times a day (TID) | OROMUCOSAL | Status: DC | PRN
Start: 2018-04-30 — End: 2018-05-15
  Filled 2018-04-30 (×2): qty 57

## 2018-04-30 MED ORDER — MIDAZOLAM HCL 2 MG/2ML IJ SOLN
INTRAMUSCULAR | Status: DC | PRN
Start: 2018-04-30 — End: 2018-04-30
  Administered 2018-04-30: 2 mg via INTRAVENOUS

## 2018-04-30 MED ORDER — FENTANYL CITRATE (PF) 50 MCG/ML IJ SOLN (WRAP)
INTRAMUSCULAR | Status: AC
Start: 2018-04-30 — End: ?
  Filled 2018-04-30: qty 4

## 2018-04-30 MED ORDER — ALBUMIN HUMAN 5 % IV SOLN
INTRAVENOUS | Status: AC
Start: 2018-04-30 — End: ?
  Filled 2018-04-30: qty 250

## 2018-04-30 SURGICAL SUPPLY — 62 items
BAG DRAINAGE 32 OZ FLO VALVE STRAP LEG (Procedure Accessories) ×4 IMPLANT
BAG DRN VNYL FBRC 32OZ BARD FLPFL DSABG (Procedure Accessories) ×2
CANNULA PERFUSION AORTIC ROOT OD14 GA (Introducer) ×4
CANNULA PERFUSION AORTIC RT 14GA 5.5IN DLP ADULT ANTEGRADE VNT LN TP (Introducer) ×4 IMPLANT
CONTAINER SPEC 8OZ NS SNPON LID TRNLU (Suction) ×3 IMPLANT
DRAIN INCS SIL RND 19FR .25IN LF STRL (Drain) ×2
DRAIN OD19 FR RADIOPAQUE 4 FREE FLOW (Drain) ×4
DRAIN OD19 FR RADIOPAQUE 4 FREE FLOW CHANNEL FULL FLUTE CHANNEL DRAIN (Drain) ×4 IMPLANT
DRAPE SRG TBRN LG CNVRT 98X72IN LF STRL (Drape) ×1
DRAPE SURGICAL FANFOLD L98 IN X W72 IN (Drape) ×2
DRAPE SURGICAL FANFOLD L98 IN X W72 IN CONVERTORS TIBURON LARGE (Drape) ×2 IMPLANT
DRESSING SCR TGDRM 4.5X3.5IN LF STRL FLM (Dressing) ×1
DRESSING SECUREMENT TEGADERM L4 1/2 IN X (Dressing) ×2
DRESSING SECUREMENT TEGADERM L4 1/2 IN X W3 1/2 IN INTRAVENOUS FILM (Dressing) ×2 IMPLANT
DRESSING TRANSPARENT L4 3/4 IN X W4 IN (Dressing) ×8
DRESSING TRANSPARENT L4 3/4 IN X W4 IN POLYURETHANE ADHESIVE (Dressing) ×8 IMPLANT
DRESSING TRNS PU STD TGDRM 4.75X4IN LF (Dressing) ×4
GAUZE SPONGE VERSLN 4PLY 4X4IN (Dressing) ×3 IMPLANT
GLOVE SRG NTR RBR 6 BGL SRG LTX STRL PF (Glove) ×1
GLOVE SURG BIOGEL INDIC SZ 6.5 (Glove) ×3 IMPLANT
GLOVE SURGICAL 6 BIOGEL SURGEONS POWDER (Glove) ×2
GLOVE SURGICAL 6 BIOGEL SURGEONS POWDER FREE BEAD CUFF TEXTURE SURFACE (Glove) ×2 IMPLANT
INTRO MEDTRONIC 7FR SET (Introducer) ×2
SLEEVE SEQUEN COMP KNEE REG (Procedure Accessories) ×3 IMPLANT
SOLUTION IRR 0.9% NACL 1000ML LF STRL (Irrigation Solutions) ×1
SOLUTION IRRIGATION 0.9% SODIUM CHLORIDE (Irrigation Solutions) ×2
SOLUTION IRRIGATION 0.9% SODIUM CHLORIDE 1000 ML PLASTIC POUR BOTTLE (Irrigation Solutions) ×2 IMPLANT
SPONGE GAUZE L4 IN X W4 IN 16 PLY (Dressing) ×2
SPONGE GAUZE L4 IN X W4 IN 16 PLY MAXIMUM ABSORBENT USP TYPE VII (Dressing) ×2 IMPLANT
SPONGE GZE CTTN CRTY 4X4IN LF NS 16 PLY (Dressing) ×1
SPONGE LAP CTTN 18X18IN LF STRL 4 PLY (Sponge) ×8
SPONGE LAPAROTOMY L18 IN X W18 IN 4 PLY (Sponge) ×16
SPONGE LAPAROTOMY L18 IN X W18 IN 4 PLY RADIOPAQUE PYRONEMA FREE HIGH (Sponge) ×16 IMPLANT
SUTURE ABS 1 CT1 PDS2 27IN MFL VIOL (Suture) ×2
SUTURE ABS 1 TP-1 PDS2 96IN MFL LOOP (Suture) ×2
SUTURE ABS 3-0 SH VCL 18IN CR BRD 8 STRN (Suture) ×1
SUTURE COATED VICRYL 3-0 SH L18 IN (Suture) ×2
SUTURE COATED VICRYL 3-0 SH L18 IN CONTROL BRD 8 STRAND UNDYED ABSRBBL (Suture) ×2 IMPLANT
SUTURE ETHILON 2-0 30IN PSLX (Suture) ×6 IMPLANT
SUTURE NABSB 3-0 SH PRLN 30IN MFL BLU (Suture) ×2
SUTURE NABSB SLK 3-0 SH PRMHND 18IN CR (Suture) ×1
SUTURE NABSB SLK 3-0 SH PRMHND 30IN BRD (Suture) ×2
SUTURE PDS II 1 CT-1 L27 IN MONOFILAMENT (Suture) ×4
SUTURE PDS II 1 CT-1 L27 IN MONOFILAMENT VIOLET ABSORBABLE (Suture) ×4 IMPLANT
SUTURE PDS II 1 TP-1 L96 IN MONOFILAMENT (Suture) ×4
SUTURE PDS II 1 TP-1 L96 IN MONOFILAMENT LOOP VIOLET ABSORBABLE (Suture) ×4 IMPLANT
SUTURE PDS II 3-0 SH 8X18IN (Suture) ×3 IMPLANT
SUTURE PROLENE BLUE 3-0 SH L30 IN (Suture) ×4
SUTURE PROLENE BLUE 3-0 SH L30 IN MONOFILAMENT NONABSORBABLE (Suture) ×4 IMPLANT
SUTURE SILK PERMA HAND BLACK 3-0 SH L18 (Suture) ×2
SUTURE SILK PERMA HAND BLACK 3-0 SH L18 IN CONTROL RELEASE BRAID 8 (Suture) ×2 IMPLANT
SUTURE SILK PERMA HAND BLACK 3-0 SH L30 (Suture) ×4
SUTURE SILK PERMA HAND BLACK 3-0 SH L30 IN BRAID NONABSORBABLE (Suture) ×4 IMPLANT
SYRINGE 20 ML BD LUER-LOK MEDICAL (Syringes, Needles) ×4 IMPLANT
SYRINGE MED 20ML LL LF STRL (Syringes, Needles) ×6
TOWEL L27 IN X W17 IN COTTON PREWASH (Other) ×2
TOWEL L27 IN X W17 IN COTTON PREWASH DELINT HIGH ABSORBENT BLUE (Other) ×2 IMPLANT
TOWEL SRG CTTN 27X17IN LF STRL PREWASH (Other) ×1
TRAY LAPAROSCOPY GYN (Pack) ×3 IMPLANT
TROCAR BLADELESS ENDO 12X10MM (Laparoscopy Supplies) ×3 IMPLANT
TROCAR BLADELESS ENDO 5X100MM (Laparoscopy Supplies) ×6 IMPLANT
TUBE T CATTELL 9/64IN LUM 16F (Tubes) ×3 IMPLANT
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
## 2018-04-30 NOTE — Brief Op Note (Signed)
BRIEF OP NOTE    Date Time: 04/30/18 5:18 PM    Patient Name:   [name]    Date of Operation:   04/30/2018    Providers Performing:   Surgeon(s) and Role:     * Lorin Glass, MD - Primary     * Stodghill, Alric Seton, DO - Resident - Assisting    Operative Procedure:   Procedure(s) with comments:  Diagnostic laparoscopy, exploratory laparotomy, abdominal exploration, duodenal repair X2, pancreatic exploration, gastrostomy tube placement, jejunostomy tube placement, decompressive jejunostomy tube placement, Abdominal drain placement x2, right knee laceration repair     Preoperative Diagnosis:   Pre-Op Diagnosis Codes:     * Traumatic hemoperitoneum, initial encounter [S36.899A]  Knee laceration  Postoperative Diagnosis:   Pancreatic contusion  Duodenal perforation x2  Colonic and mesenteric hematoma  Knee laceration  Anesthesia:   General    Fluids (I/O):   EBL:  300 mL  UOP:  Crystalloid:  Colloid:  Blood Products: None    Implants:   None    Drains:   Right upper blake drain = peripancreatic drain  Right lower blake drain = gutter and peri-duodenal drain  Left upper drain = gastrostomy tube  Left middle drain = decompressive jejunostomy tube  Left lower drain = feeding jejunostomy tube    Specimens:   None    Findings:   Duodenal perforation x2  Pancreatic contusion  Transverse colon and mesenteric hematoma     Wound Class:   Dirty    Complications:   None    Signed by: Sharlee Blew TOWER OR  I was present and participated in the entire procedure.  Patient tolerated it well.

## 2018-04-30 NOTE — ED Triage Notes (Signed)
Dr. Lonna Duval at bedside. Pt had episode of emesis, given 4mg  Zofran IV. Dog bite on right arm. Bandaged right knee

## 2018-04-30 NOTE — Anesthesia Preprocedure Evaluation (Signed)
Anesthesia Evaluation    AIRWAY    Mallampati: II    TM distance: >3 FB  Neck ROM: full  Mouth Opening:full   CARDIOVASCULAR    regular and normal       DENTAL         PULMONARY    clear to auscultation     OTHER FINDINGS                  Relevant Problems   GU/RENAL   (+) Liver laceration               Anesthesia Plan    ASA 3     general                     intravenous induction   Detailed anesthesia plan: general endotracheal        Post op pain management: per surgeon    informed consent obtained    Plan discussed with CRNA.    ECG reviewed  pertinent labs reviewed             Signed by: Lovey Newcomer 04/30/18 11:17 AM
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
## 2018-04-30 NOTE — Transfer of Care (Signed)
Anesthesia Transfer of Care Note    Patient: Jorge [name]    Procedures performed: Procedure(s) with comments:  EXPLORATORY LAPAROSCOPY - Diagnostic laparoscopy, open laparotomy, abdominal exploration, duodenal repair X2, pancreatic exploration, gastrostomy tube placement, jejunostomy tube placement, decompressive jejunostomy, right knee laceration repair   CLOSURE, WOUND    Anesthesia type: General ETT    Patient location:PACU    Last vitals:   Vitals:    04/30/18 1709   BP: 132/76   Pulse: 92   Resp: 16   Temp: 37.1 C (98.7 F)   SpO2: 100%       Post pain: Patient not complaining of pain, continue current therapy      Mental Status:awake    Respiratory Function: tolerating face mask    Cardiovascular: stable    Nausea/Vomiting: patient not complaining of nausea or vomiting    Hydration Status: adequate    Post assessment: no apparent anesthetic complications and no reportable events    Signed by: Jamison Neighbor  04/30/18 5:09 PM
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
## 2018-04-30 NOTE — Progress Notes (Signed)
Faxed TLSO request to [facility].
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
## 2018-04-30 NOTE — Progress Notes (Signed)
Name: Jorge [name]     MRN: [id number]    Ht: Height: 177.8 cm (5\' 10" )  Wt:Weight: 61.7 kg (136 lb 0.4 oz) Adm Date: 04/30/2018     Affected side: Spine    Reason for Visit: Brace fitting    Observations: Patient in the OR during brace delivery. Patient to be fit tom.               [address]  [place], [place] [address]  [phone]

## 2018-04-30 NOTE — Op Note (Signed)
OPERATIVE NOTE    Date Time: 05/01/18 11:31 AM  Patient Name: Jorge [name]  Attending Physician: Lorin Glass, MD    Date of Operation:   04/30/2018    Providers Performing:   Surgeon(s):  Lorin Glass, MD  Elmore Guise, DO    Operative Procedure:   Diagnostic laparoscopy, exploratory laparotomy, abdominal exploration, duodenal repair X2, pancreatic exploration, gastrostomy tube placement, jejunostomy tube placement, decompressive jejunostomy tube placement, Abdominal drain placement x2, right knee laceration repair     Preoperative Diagnosis:   Pre-Op Diagnosis Codes:     * Traumatic hemoperitoneum, initial encounter [S36.899A]   Right anterior knee lacerations     Postoperative Diagnosis:   Pancreatic contusion  Duodenal perforation x2  Colonic and mesenteric hematoma  Right anterior knee lacerations     Details of Procedure:   Informed consent was previously obtained. The patient was brought to the operating room and underwent induction of anesthesia and intubation. An appropriate time out was had. Attention was first directed to the right knee lacerations. This was noted to be 5cm going down to the fascia but not involving the fascia. This was washed out with betadine and closed in a single layer with nylon suture. Attention was then directed to the abdomen. The patient was prepped and draped. An additional time out was had. A periumbilical incision was made and extended down to the fascia. Transfascial stay sutures were placed. The fascia was transected sharply, and hasson trocar was placed. Pneumoperitoneum was achieved. The camera was advanced and the abdomen surveyed. There was a large hematoma underneath the omentum and the omentum was noted to cover the anterior surface of the bowel. There was some blood noted in the pelvis. There was no active bleeding noted. A left lower quadrant 5mm port was placed. Using a grasper the omentum was attempted to be retracted superiorly. There was significant  adhesions limiting this mobilization. Due to the significant adhesions and the large hematoma it was felt that we would not get to adequately mobilize the tissue to assess the duodenum and pancreas from a laparoscopic approach.     We then removed the ports and converted to a midline laparotomy incision. This was carried down to the fascia which was opened. A book walter retractor was placed. The omentum was able to be bluntly separated from the bowel. The omentum and transverse colon was retracted superiorly and the small bowel was run from the terminal ileum to the ligament of treitz. There was a mesenteric hematoma noted at the terminal ileum/cecum however the bowel was pink and healthy. The remaining bowel was unremarkable. The colon was then run from the rectum to the cecum. There was significant hematoma surrounding the transverse colon however the colon appeared viable, There was a dark lateral retroperitoneal hematoma along the edge of the cecum/ascending colon. The bladder was intact. The anterior surface of the stomach was surveyed and found to be intact. There was a grade three liver laceration noted just left of the falciform ligament with no active hemorrhage noted. The spleen was noted to be intact. At this point, the omentum was divided near the greater curve of the stomach and the lesser sac was entered. A large hematoma and clot was evacuated. There was bile noted at this time and a duodenal laceration was noted. A lap pad was placed in this area and attention was given to obtaining better exposure of this area. The white line of toldt was taken down and the right colon  was mobilized medially. There was bile stained hematoma that was evacuated along the lateral abdominal wall. The hepatic flexure was taken down and the transverse colon was mobilized inferiorly. The colon was noted to be intact with no injuries. The posterior stomach was surveyed and found to be intact with no injuries.     Attention  was then directed to the duodenal injury. There was a 2/3rds circumference transverse laceration to the second portion of the duodenum. There serosa from the first to third portions of the duodenum was torn from the underlying duodenum and was unable to be brought back into anatomic position. There was a 1.5 cm oblique laceration to the anterior surface of the deserosalized portion of the first portion of the duodenum. The ampulla was found to be at the base of the large laceration in the 1/3 section of non torn bowel. At this point the decision was made to perform primary repairs of these lacerations with plan for G tube placement, decompressive J tube placement and feeding J tube placement with wide drainage of the area. The large transverse laceration was closed with a 3-0 prolene running connell stitch. The lumen was found to be patent and there was adequate laxity to the duodenum that 3-0 silk lembert sutures were placed. Again thee lumen was found to be patent. The 1.5 cm oblique laceration was closed in one layer with 3-0 prolene running suture. It was felt that attempts of lembert suture in this region would lead to stricture and such stitches were not placed. The area was then thoroughly irrigated with saline. The area was found to be clean and with no further leaking of bilious drainage. The pancreas was then evaluated and found to have significant ecchymosis and edema in the head of the pancreas. There was no main duct injury identified and not drainage of pancreatic secretions noted. The abdomen was then thoroughly irrigated with five liters of saline, until irrigation returned non-bilious.     Attention was directed to creating the enteric tube access. Silk pursestring was doubly placed in the stomach. A 60F gastrostomy tube was tunneled through the skin into the abdomen. Gastrotomy was made and the balloon was placed in the stomach. The balloon was inflated with 10ml of saline. The purse strings were  tied and the stomach was tacked to the abdominal wall with silk suture. The small bowel was run to the ligament of treitz. The bowel was followed distally a short distance to a level that would freely come to the abdominal wall. A 53F jejunostomy tube was the placed through the skin. A pursestring suture was placed and enterotomy was made. The jejunostomy tube was cut to length and placed in the jejunum. It was fed up to the ligament of treitz. This was secured with a witzel stitch. The balloon was left empty. A second jejunostomy was created in a similar fashion however with the tubing running distally. Again the balloon was left empty. The anastomosis was once again surveyed and no leakage of bile was noted. The abdomen was once again irrigated with non-bilious drainage noted. Two drains were placed through the right abdominal wall. The upper drain was laid along the pancreas, and the lower drain was laid in the lateral gutter and next to the duodenal repair. The omentum and transverse colon was then placed over this area. All enteral tubes and drains were secured with 2-0 nylon. The fascia was closed with running #1 looped PDS suture. The wound was irrigated. The skin was  closed with staples and dressed with 4x4 gauze and tape.     The patient was awoken from anesthesia and extubated. He tolerated the procedure well with no complications. He was transported to the recovery room in stable condition.     Estimated Blood Loss:      Replaced fluids:   Crystalloid:  Colloid:  Blood products: None    Implants:   None    Drains:   Right upper blake drain = peripancreatic drain  Right lower blake drain = gutter and peri-duodenal drain  Left upper drain = gastrostomy tube  Left middle drain = decompressive jejunostomy tube  Left lower drain = feeding jejunostomy tube    Specimens:   None    Complications:   None      Signed by:   Loetta Rough, DO  General Surgery Resident  Pager Number: [phone]    I  was present and participated in the entire case.  Patient tolerated the procedure well.

## 2018-04-30 NOTE — Progress Notes (Signed)
Surgery Post-Op Check Note  04/30/2018      Subjective   Pt seen and examined at bedside post-op. Pain controlled. JP drains with bilious-serous output. Tachycardic to 120s, normotensive. No gas/BM. AUOP.    Objective   BP 103/65   Pulse 96   Temp 98.9 F (37.2 C)   Resp (!) 24   Ht 1.778 m (5\' 10" )   Wt 61.7 kg (136 lb 0.4 oz)   SpO2 99%   BMI 19.52 kg/m     PHYSICAL EXAM:  General: NAD  HEENT: No scleral icterus, EOMI  Chest:  Normal respiratory effort, CTAB  Heart:  RRR  Abdomen:  Soft, non-distended, appropriately TTP, no rebound or guarding. JP drain x2 with mixed bilious-serous output. G- and decompressive J-tube to gravity. Feeding J-tube clamped  Extremities:  MAE, no edema, palpable radial and DP pulses bilaterally  Neuro:  Grossly normal, CN intact    Assessment/Plan   Jorge [name] is a 22 y.o. male who is POD#0 s/p ex-lap, duodenal repair, pancreatic exploration, gastrostomy tube placement, jejunostomy tube placement x2. Doing well post-operatively.     Neuro: Continue current pain control regimen. TLSO when HOB > 30   Pulm: Pulm toilet, IS  CV: Monitor vital signs. LR bolus given for tachycardia with good effect  Endo: NAI  GI: Strict NPO tonight, consider TF via feeding J-tube in AM  Renal: Monitor UOP  Heme/ID: 2u FFP given for post-op INR of 1.9, follow-up repeat in AM      Memory Argue, MD  General Surgery, PGY-2
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
## 2018-04-30 NOTE — ED Notes (Signed)
Bed: S 21  Expected date:   Expected time:   Means of arrival: [facility]  Comments:  [facility]
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
## 2018-04-30 NOTE — Progress Notes (Signed)
Attempted to insert NGT in bil nares. Unsuccessful attempts x 2. Notified Tor Netters NP.

## 2018-05-01 ENCOUNTER — Inpatient Hospital Stay: Payer: No Typology Code available for payment source

## 2018-05-01 ENCOUNTER — Encounter: Payer: Self-pay | Admitting: Surgery

## 2018-05-01 LAB — BASIC METABOLIC PANEL
BUN: 13 mg/dL (ref 9.0–28.0)
BUN: 12 mg/dL (ref 9.0–28.0)
CO2: 26 mEq/L (ref 22–29)
CO2: 23 mEq/L (ref 22–29)
Calcium: 7.9 mg/dL — ABNORMAL LOW (ref 8.5–10.5)
Calcium: 7.9 mg/dL — ABNORMAL LOW (ref 8.5–10.5)
Chloride: 106 mEq/L (ref 100–111)
Chloride: 105 mEq/L (ref 100–111)
Creatinine: 1.1 mg/dL (ref 0.7–1.3)
Creatinine: 1.1 mg/dL (ref 0.7–1.3)
Glucose: 98 mg/dL (ref 70–100)
Glucose: 90 mg/dL (ref 70–100)
Potassium: 4 mEq/L (ref 3.5–5.1)
Potassium: 4.2 mEq/L (ref 3.5–5.1)
Sodium: 140 mEq/L (ref 136–145)
Sodium: 135 mEq/L — ABNORMAL LOW (ref 136–145)

## 2018-05-01 LAB — MAN DIFF ONLY
Band Neutrophils Absolute: 2.08 10*3/uL — ABNORMAL HIGH (ref 0.00–1.00)
Band Neutrophils: 32 %
Basophils Absolute Manual: 0 10*3/uL (ref 0.00–0.08)
Basophils Manual: 0 %
Eosinophils Absolute Manual: 0 10*3/uL (ref 0.00–0.44)
Eosinophils Manual: 0 %
Lymphocytes Absolute Manual: 0.91 10*3/uL (ref 0.42–3.22)
Lymphocytes Manual: 14 %
Metamyelocytes Absolute: 0.13 10*3/uL — ABNORMAL HIGH
Metamyelocytes: 2 %
Monocytes Absolute: 0.2 10*3/uL — ABNORMAL LOW (ref 0.21–0.85)
Monocytes Manual: 3 %
Neutrophils Absolute Manual: 3.19 10*3/uL (ref 1.10–6.33)
Segmented Neutrophils: 49 %

## 2018-05-01 LAB — HEPATIC FUNCTION PANEL
ALT: 56 U/L — ABNORMAL HIGH (ref 0–55)
AST (SGOT): 101 U/L — ABNORMAL HIGH (ref 5–34)
Albumin/Globulin Ratio: 2 (ref 0.9–2.2)
Albumin: 3.2 g/dL — ABNORMAL LOW (ref 3.5–5.0)
Alkaline Phosphatase: 35 U/L — ABNORMAL LOW (ref 38–106)
Bilirubin Direct: 0.3 mg/dL (ref 0.0–0.5)
Bilirubin Indirect: 2.8 mg/dL — ABNORMAL HIGH (ref 0.2–1.0)
Bilirubin, Total: 3.1 mg/dL — ABNORMAL HIGH (ref 0.2–1.2)
Globulin: 1.6 g/dL — ABNORMAL LOW (ref 2.0–3.6)
Protein, Total: 4.8 g/dL — ABNORMAL LOW (ref 6.0–8.3)

## 2018-05-01 LAB — GFR
EGFR: >60
EGFR: >60

## 2018-05-01 LAB — CBC AND DIFFERENTIAL
Absolute NRBC: 0 10*3/uL (ref 0.00–0.00)
Hematocrit: 26.8 % — ABNORMAL LOW (ref 37.6–49.6)
Hgb: 9 g/dL — ABNORMAL LOW (ref 12.5–17.1)
MCH: 30.2 pg (ref 25.1–33.5)
MCHC: 33.6 g/dL (ref 31.5–35.8)
MCV: 89.9 fL (ref 78.0–96.0)
MPV: 9.8 fL (ref 8.9–12.5)
Nucleated RBC: 0 /100 WBC (ref 0.0–0.0)
Platelets: 164 10*3/uL (ref 142–346)
RBC: 2.98 10*6/uL — ABNORMAL LOW (ref 4.20–5.90)
RDW: 13 % (ref 11–15)
WBC: 6.5 10*3/uL (ref 3.10–9.50)

## 2018-05-01 LAB — PT/INR
PT INR: 1.6 — ABNORMAL HIGH (ref 0.9–1.1)
PT: 18.8 s — ABNORMAL HIGH (ref 12.6–15.0)

## 2018-05-01 LAB — LACTIC ACID, PLASMA: Lactic Acid: 1.2 mmol/L (ref 0.2–2.0)

## 2018-05-01 LAB — MRSA CULTURE
Culture MRSA Surveillance: NEGATIVE
Culture MRSA Surveillance: NEGATIVE

## 2018-05-01 LAB — HEMOGLOBIN AND HEMATOCRIT, BLOOD
Hematocrit: 27.5 % — ABNORMAL LOW (ref 37.6–49.6)
Hematocrit: 28.7 % — ABNORMAL LOW (ref 37.6–49.6)
Hematocrit: 28.4 % — ABNORMAL LOW (ref 37.6–49.6)
Hgb: 9.2 g/dL — ABNORMAL LOW (ref 12.5–17.1)
Hgb: 9.5 g/dL — ABNORMAL LOW (ref 12.5–17.1)
Hgb: 9.1 g/dL — ABNORMAL LOW (ref 12.5–17.1)

## 2018-05-01 LAB — APTT: PTT: 37 s (ref 23–37)

## 2018-05-01 LAB — MAGNESIUM: Magnesium: 2.5 mg/dL (ref 1.6–2.6)

## 2018-05-01 LAB — CELL MORPHOLOGY
Cell Morphology: ABNORMAL — AB
Platelet Estimate: NORMAL
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
## 2018-05-01 NOTE — Plan of Care (Signed)
Problem: Pain  Goal: Pain at adequate level as identified by patient  Outcome: Progressing  Flowsheets (Taken 05/01/2018 0331)  Pain at adequate level as identified by patient: Identify patient comfort function goal; Evaluate if patient comfort function goal is met; Evaluate patient's satisfaction with pain management progress; Reassess pain within 30-60 minutes of any procedure/intervention, per Pain Assessment, Intervention, Reassessment (AIR) Cycle; Offer non-pharmacological pain management interventions; Include patient/patient care companion in decisions related to pain management as needed     Problem: Side Effects from Pain Analgesia  Goal: Patient will experience minimal side effects of analgesic therapy  Outcome: Progressing
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
## 2018-05-01 NOTE — Progress Notes (Signed)
Name: Jorge [name]     MRN: [id number]    Ht: Height: 177.8 cm (5\' 10" )  Wt:Weight: 61.7 kg (136 lb 0.4 oz) Adm Date: 04/30/2018     Affected side: spine    Reason for Visit: evaluation, fitting and delivery    Observations: Patient was laying supine in bed with nurse and physician present in the room.     Today's Session: fit and delivered.Before fitting don and doffing instructions were communicated to the patient.Patient was fit via log roll with assistance from patients nurse.Patient was able to tolerate the brace and brace was left on afterwards with HOB elevated.    Brace Issued: SLEEQ TLSO     Ordering Physician:     Delivery Receipt Signed by:  Pt not able to sign       Patient/Caretaker is able to properly don and doff device - Yes and Educational handouts provided             [address]  [place], [place] [address]  [phone]
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
## 2018-05-01 NOTE — Progress Notes (Signed)
Orthosis Wearing Schedule  Your Physician prescribed a TLSO for you to wear when you are upright and out of the bed  DO NOT SLEEP in the TLSO  Wear something form fitting underneath the TLSO  Check your skin each time you put on and take off the TLSO    Adjustment Period  There is an adjustment period with nearly every type of orthosis.  The body is not used to wearing an orthosis, and needs to adjust to it gradually.  Typically, the orthotist will give you a wearing schedule to give your body adequate time to adjust to the orthosis.  It is important to adhere to the wearing schedule.  Over-wearing the brace at first can result in soreness and fatigue.    Wearing schedule - A typical wearing schedule is starting with 2 hours of wear per day, and gradually increasing by 2 hour increments each successive day.  If it begins to feel sore, you can remove it early for that day.   But do not over-wear the orthosis.    If your doctor has given specific wearing instructions, their instructions supersede these instructions.    If manufacturers wearing instructions were provided these are to be followed.  It is not recommended you operate a vehicle while wearing your orthosis as it can impede your driving.                                                                                               Need for Adjustment  Pay close attention to your skin when you remove the orthosis.  If you are wearing the orthosis correctly, and it is a good fit, you should have an overall pink or dark hue of the skin when the orthosis is removed.  However, the skin tone should quickly return to normal once removed.  If a red or dark spot stays for longer than 30 minutes after the orthosis is removed, you are in danger of developing a hot-spot or blister.  Also check for any bruising, callous or rash.  Please discontinue wear and schedule a follow up appointment with your orthotist.  The orthotist can make an adjustment that will relieve this  spot and restore proper fit and function to the brace.    Call our office if you need an adjustment, have questions or need to schedule follow up appointment.  Cleaning, Care and Maintenance  Plastic braces can be cleaned with soap and water and thoroughly washed.  Another option is to mix a half and half solution of alcohol and water and rub down the orthosis.  Wipe dry.    Braces with cloth should be hand washed with soap and water, and well rinsed.  Lay flat or hang to dry.      [address], BLDG F     [phone](F)         [place], [place]  [address]
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
## 2018-05-01 NOTE — Plan of Care (Signed)
Alert and orientated, his pain keeps him aware of danger but we are treating it every 2 hrs. Unable to turn self, log roll q 2 hrs with brace off, with on was able to sit on side of bed and stand and walk for his xrays.

## 2018-05-01 NOTE — Progress Notes (Signed)
ITEMS FOR DAILY REVIEW-SCCS Checklist    Select YES    OR    Select "N/E If Not Eligible, N/A forNot Applicable, or C/I for Contraindicated    D/C Daily X-Rays or Labs Yes   SCCS Ventilator Protocol Ordered N/A   Fever Protocol Ordered Yes   Electrolyte Protocol Order Set Completed N/E   Order to Wean Sedation Drips for RASS  (0) to (-2) N/A      Bowel Regimen Ordered (Senokot, Colace, Dulcolax Suppository) N/A   Obtain 30-Day ICU Procedure Consent Yes   Order Turn Q2 Hours by RN as appropriate Yes   Weekly Interim Summary Done Yes     On Lovenox If Eligible C/I   HOB Elevated 30-45 Degrees Yes   WUB Ventilator Protocol Ordered-Spontaneous Awakening Trial Today N/A   WUB Ventilator Protocol Ordered-Spontaneous Breathing Trial Today N/A   Eligibility For Extubation Reviewed N/A   Need Tracheostomy? N/A   Tube Feeds at Goal/Nutritional Assessment Yes   Gastritis Prophylaxis (Feeds, Sucralfate, H2 Blocker, PPI) Yes   Glucose controlled? (Insulin Protocol Needed?) Yes   D/C Antibiotics Today N/E   D/C Central Line And/Or Foley Catheter N/E   Exchange or D/C Central Lines from ER/OR N/E   MD/RN Plan Made for Any Transport From Room Yes   Patient Acuity Designation Correct Yes   Cervical Spine Clearance Reviewed N/E   Review Need for PT/OT, PM&R, or Speech Therapy Yes   Labs and Radiologic Studies Reviewed Yes   Review Current or Home Meds for Start or D/C Yes     Physician, Nurse and Pharmacist
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
## 2018-05-02 ENCOUNTER — Inpatient Hospital Stay: Payer: No Typology Code available for payment source

## 2018-05-02 DIAGNOSIS — S12500A Unspecified displaced fracture of sixth cervical vertebra, initial encounter for closed fracture: Secondary | ICD-10-CM

## 2018-05-02 DIAGNOSIS — S22089A Unspecified fracture of T11-T12 vertebra, initial encounter for closed fracture: Secondary | ICD-10-CM

## 2018-05-02 LAB — CBC AND DIFFERENTIAL
Absolute NRBC: 0 10*3/uL (ref 0.00–0.00)
Basophils Absolute Automated: 0.01 10*3/uL (ref 0.00–0.08)
Basophils Automated: 0.1 %
Eosinophils Absolute Automated: 0.01 10*3/uL (ref 0.00–0.44)
Eosinophils Automated: 0.1 %
Hematocrit: 26.1 % — ABNORMAL LOW (ref 37.6–49.6)
Hgb: 8.6 g/dL — ABNORMAL LOW (ref 12.5–17.1)
Immature Granulocytes Absolute: 0.05 10*3/uL (ref 0.00–0.07)
Immature Granulocytes: 0.6 %
Lymphocytes Absolute Automated: 0.8 10*3/uL (ref 0.42–3.22)
Lymphocytes Automated: 9.9 %
MCH: 29.6 pg (ref 25.1–33.5)
MCHC: 33 g/dL (ref 31.5–35.8)
MCV: 89.7 fL (ref 78.0–96.0)
MPV: 10.8 fL (ref 8.9–12.5)
Monocytes Absolute Automated: 0.38 10*3/uL (ref 0.21–0.85)
Monocytes: 4.7 %
Neutrophils Absolute: 6.87 10*3/uL — ABNORMAL HIGH (ref 1.10–6.33)
Neutrophils: 84.6 %
Nucleated RBC: 0 /100 WBC (ref 0.0–0.0)
Platelets: 171 10*3/uL (ref 142–346)
RBC: 2.91 10*6/uL — ABNORMAL LOW (ref 4.20–5.90)
RDW: 13 % (ref 11–15)
WBC: 8.12 10*3/uL (ref 3.10–9.50)

## 2018-05-02 LAB — HEPATIC FUNCTION PANEL
ALT: 54 U/L (ref 0–55)
AST (SGOT): 145 U/L — ABNORMAL HIGH (ref 5–34)
Albumin/Globulin Ratio: 1.4 (ref 0.9–2.2)
Albumin: 3 g/dL — ABNORMAL LOW (ref 3.5–5.0)
Alkaline Phosphatase: 45 U/L (ref 38–106)
Bilirubin Direct: 0.5 mg/dL (ref 0.0–0.5)
Bilirubin Indirect: 3.1 mg/dL — ABNORMAL HIGH (ref 0.2–1.0)
Bilirubin, Total: 3.6 mg/dL — ABNORMAL HIGH (ref 0.2–1.2)
Globulin: 2.1 g/dL (ref 2.0–3.6)
Protein, Total: 5.1 g/dL — ABNORMAL LOW (ref 6.0–8.3)

## 2018-05-02 LAB — HEMOGLOBIN AND HEMATOCRIT, BLOOD
Hematocrit: 29.4 % — ABNORMAL LOW (ref 37.6–49.6)
Hematocrit: 26.1 % — ABNORMAL LOW (ref 37.6–49.6)
Hgb: 9.4 g/dL — ABNORMAL LOW (ref 12.5–17.1)
Hgb: 8.6 g/dL — ABNORMAL LOW (ref 12.5–17.1)

## 2018-05-02 MED ORDER — ENOXAPARIN SODIUM 30 MG/0.3ML SC SOLN
30.00 mg | Freq: Two times a day (BID) | SUBCUTANEOUS | Status: DC
Start: 2018-05-02 — End: 2018-05-04
  Administered 2018-05-02 – 2018-05-04 (×4): 30 mg via SUBCUTANEOUS
  Filled 2018-05-02 (×4): qty 0.3

## 2018-05-02 MED ORDER — BENZOCAINE-MENTHOL 15-3.6 MG MT LOZG
1.00 | LOZENGE | OROMUCOSAL | Status: DC | PRN
Start: 2018-05-02 — End: 2018-05-15

## 2018-05-02 MED ORDER — IOHEXOL 350 MG/ML IV SOLN
100.00 mL | Freq: Once | INTRAVENOUS | Status: AC | PRN
Start: 2018-05-02 — End: 2018-05-02
  Administered 2018-05-02: 15:00:00 100 mL via INTRAVENOUS

## 2018-05-02 MED ORDER — ENOXAPARIN SODIUM 30 MG/0.3ML SC SOLN
30.00 mg | Freq: Two times a day (BID) | SUBCUTANEOUS | Status: DC
Start: 2018-05-02 — End: 2018-05-02
  Administered 2018-05-02: 09:00:00 30 mg via SUBCUTANEOUS
  Filled 2018-05-02: qty 0.3
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
## 2018-05-03 LAB — HEMOGLOBIN AND HEMATOCRIT, BLOOD
Hematocrit: 25.8 % — ABNORMAL LOW (ref 37.6–49.6)
Hgb: 8.6 g/dL — ABNORMAL LOW (ref 12.5–17.1)

## 2018-05-03 LAB — BASIC METABOLIC PANEL
BUN: 10 mg/dL (ref 9.0–28.0)
CO2: 25 mEq/L (ref 22–29)
Calcium: 8.4 mg/dL — ABNORMAL LOW (ref 8.5–10.5)
Chloride: 108 mEq/L (ref 100–111)
Creatinine: 1.1 mg/dL (ref 0.7–1.3)
Glucose: 98 mg/dL (ref 70–100)
Potassium: 3.9 mEq/L (ref 3.5–5.1)
Sodium: 141 mEq/L (ref 136–145)

## 2018-05-03 LAB — CBC
Absolute NRBC: 0 10*3/uL (ref 0.00–0.00)
Hematocrit: 25.7 % — ABNORMAL LOW (ref 37.6–49.6)
Hgb: 8.5 g/dL — ABNORMAL LOW (ref 12.5–17.1)
MCH: 29.6 pg (ref 25.1–33.5)
MCHC: 33.1 g/dL (ref 31.5–35.8)
MCV: 89.5 fL (ref 78.0–96.0)
MPV: 10.8 fL (ref 8.9–12.5)
Nucleated RBC: 0 /100 WBC (ref 0.0–0.0)
Platelets: 172 10*3/uL (ref 142–346)
RBC: 2.87 10*6/uL — ABNORMAL LOW (ref 4.20–5.90)
RDW: 13 % (ref 11–15)
WBC: 8.75 10*3/uL (ref 3.10–9.50)

## 2018-05-03 LAB — MAGNESIUM: Magnesium: 1.9 mg/dL (ref 1.6–2.6)

## 2018-05-03 LAB — GFR: EGFR: >60
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
## 2018-05-03 NOTE — Plan of Care (Signed)
Pt a/o x 4, complains of abdominal pain, relieved by prn dilaudid. NPO, feeding tube in place, tolerating well. Drain care provided, see flowsheets. 30cc bile re-fed as ordered q6h, pt tolerated well. Denies sob, resp distress, chest pain. Pt independent with urinal. Assisted with repositioning q2h. Police officer at bedside throughout shift.

## 2018-05-03 NOTE — PT Progress Note (Signed)
[facility]   Physical Therapy Cancellation Note      Patient:  Jorge [name] MRN#:  [id number]  Unit:  [facility] TOWER 3 Room/Bed:  F335/F335.01    05/03/2018  Time: 1302      Pt not seen for physical therapy secondary to stating "I want to sleep a little longer because I did not sleep well last night". Pt's RN aware of pt's refusal.     Cameron Sprang PT, DPT,CLT  (pager [phone])

## 2018-05-04 DIAGNOSIS — S36899A Unspecified injury of other intra-abdominal organs, initial encounter: Secondary | ICD-10-CM

## 2018-05-04 LAB — BASIC METABOLIC PANEL
BUN: 11 mg/dL (ref 9.0–28.0)
CO2: 24 mEq/L (ref 22–29)
Calcium: 7.8 mg/dL — ABNORMAL LOW (ref 7.9–10.2)
Chloride: 108 mEq/L (ref 100–111)
Creatinine: 0.8 mg/dL (ref 0.7–1.3)
Glucose: 103 mg/dL — ABNORMAL HIGH (ref 70–100)
Potassium: 4 mEq/L (ref 3.5–5.1)
Sodium: 141 mEq/L (ref 136–145)

## 2018-05-04 LAB — HEMOGLOBIN AND HEMATOCRIT, BLOOD
Hematocrit: 21 % — ABNORMAL LOW (ref 37.6–49.6)
Hgb: 6.7 g/dL — ABNORMAL LOW (ref 12.5–17.1)

## 2018-05-04 LAB — TYPE AND SCREEN
AB Screen Gel: NEGATIVE
ABO Rh: O POS

## 2018-05-04 LAB — HEPATIC FUNCTION PANEL
ALT: 52 U/L (ref 0–55)
AST (SGOT): 86 U/L — ABNORMAL HIGH (ref 5–34)
Albumin/Globulin Ratio: 1.1 (ref 0.9–2.2)
Albumin: 2.5 g/dL — ABNORMAL LOW (ref 3.5–5.0)
Alkaline Phosphatase: 41 U/L (ref 38–106)
Bilirubin Direct: 0.5 mg/dL (ref 0.0–0.5)
Bilirubin Indirect: 0.9 mg/dL (ref 0.2–1.0)
Bilirubin, Total: 1.4 mg/dL — ABNORMAL HIGH (ref 0.2–1.2)
Globulin: 2.3 g/dL (ref 2.0–3.6)
Protein, Total: 4.8 g/dL — ABNORMAL LOW (ref 6.0–8.3)

## 2018-05-04 LAB — CBC
Absolute NRBC: 0 10*3/uL (ref 0.00–0.00)
Hematocrit: 23.1 % — ABNORMAL LOW (ref 37.6–49.6)
Hgb: 7.6 g/dL — ABNORMAL LOW (ref 12.5–17.1)
MCH: 29.7 pg (ref 25.1–33.5)
MCHC: 32.9 g/dL (ref 31.5–35.8)
MCV: 90.2 fL (ref 78.0–96.0)
MPV: 10.6 fL (ref 8.9–12.5)
Nucleated RBC: 0 /100 WBC (ref 0.0–0.0)
Platelets: 218 10*3/uL (ref 142–346)
RBC: 2.56 10*6/uL — ABNORMAL LOW (ref 4.20–5.90)
RDW: 13 % (ref 11–15)
WBC: 4.8 10*3/uL (ref 3.10–9.50)

## 2018-05-04 LAB — GFR: EGFR: >60

## 2018-05-04 MED ORDER — ONDANSETRON HCL 4 MG/2ML IJ SOLN
4.00 mg | Freq: Three times a day (TID) | INTRAMUSCULAR | Status: DC | PRN
Start: 2018-05-04 — End: 2018-05-19
  Administered 2018-05-05 – 2018-05-09 (×3): 4 mg via INTRAVENOUS
  Filled 2018-05-04 (×4): qty 2
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
## 2018-05-04 NOTE — PT Progress Note (Signed)
[facility]   Physical Therapy Treatment  Patient:  Jorge [name] MRN#:  [id number]  Unit: [facility] TOWER 3  Bed: F335/F335.01    Discharge Recommendations:   D/C Recommendations: Home with supervision, Home with home health PT   DME Recommendations: (TBD)     If Home with supervision, Home with home health PT recommended discharge disposition is not available, patient will need Rehab.     Recommendations can change; please see most recent physical therapy treatment note for updates.     Assessment:   Patient recd in bed with Aspen collar in place. Agreeable to PT. Premedicated d/t c/o pain. Performed log roll to L side CGA, Supine to sit Min-Mod A. Able to Don TLSO at [facility] with Mod A and verbal cues. Sit to stand CGA-SBA. Able to ambulate 15-75 ft intervals with increasing c/o dizziness with prolonged mobility requiring seated rest breaks. CGA-Min A for safety using RW for portion of session and then HHA. Cont with POC    Treatment Activities:   Bed mobility, transfers, gait    Educated the patient to role of physical therapy, plan of care, goals of therapy and HEP, safety with mobility and ADLs, spine precautions, Pegram plans and recs.    Plan:   Treatment/Interventions: Exercise, Gait training, Stair training, Neuromuscular re-education, Compensatory technique education, Bed mobility, Equipment eval/education, Patient/family training, Endurance training, LE strengthening/ROM, Functional transfer training        PT Frequency: 3-4x/wk     Continue plan of care.       Precautions and Contraindications:   Fall Risk  Spine Precautions  TLSO - able to Don at [employer] at all times  Multiple Abdominal drains  PEG - continuous    Admitting Medical Diagnosis:   Laceration of liver, initial encounter [S36.113A]  Liver laceration [V40.981X]      Updated Medical Status/Imaging/Labs:   No results found.  Lab Results   Component Value Date/Time    HGB 7.6 (L) 05/04/2018 06:01 AM    HCT 23.1 (L)  05/04/2018 06:01 AM    K 4.0 05/04/2018 06:01 AM    NA 141 05/04/2018 06:01 AM    INR 1.6 (H) 05/01/2018 01:07 AM         Subjective:   "I get dizzy when I sit up"    Pain Assessment:  Pain Scale:wong baker faces  Pain Score: 6  Pain Location: abdomen  Pain Intervention: premedicated      Patient's medical condition is appropriate for Physical Therapy intervention at this time.  Patient is agreeable to participation in the therapy session. Nursing clears patient for therapy. Prison Guard present throughout. OT present during session for safety awareness.    Objective:   Patient is in bed with peripheral IV, JP Drain x 2, nasogastric suction, PEG tube, enterostomy/gastrostomy drain x 2 and cervical collar in place.    Vitals:  stable    Cognition/Neuro Status:  Orientation Level: alert and oriented  Behavior: cooperative, anxious  Safety awareness: educated      Functional Mobility/Transfers:  Rolling: CGA to L side  Supine to Sit: Min-Mod A via log roll  Sit to Stand: CGA-Min A  Stand to Sit: CGA  Bed to Chair: CGA  Stand Pivot: CGA      Ambulation:  PMP Activity: Step 7 - Walks out of Room  Distance: 15 feet x 1, 25 feet x 1, 75 feet x1, 45 feet x1  Assist: CGA-Min A  Assistive Device: RW or HHA  Pattern: decreased cadence and step length, flexed posture  Number of Stairs: nt      Therapeutic Exercise:  During activity      Patient Participation: Good  Patient Endurance: Good-      Patient left with call bell within reach, all needs met, and all questions answered. RN notified of session outcome and patient response.     SCD: na  Fall mat: in place  Bed alarm: na  Chair alarm: on   Avasys: na      Goals:  Goals  Goal Formulation: With patient  Time for Goal Acheivement: 5 visits  Pt Will Go Supine To Sit: modified independent, with supervision  Pt Will Perform Sit To Supine: modified independent, with supervision  Pt Will Perform Sit to Stand: modified independent, with supervision  Pt Will Transfer Bed/Chair:  modified independent, with supervision  Pt Will Ambulate: > 200 feet, with supervision, with stand by assist  Pt Will Go Up / Down Stairs: 1 flight, With rail, with stand by assist  Other Goal: Pt will safely don/doff TLSO at EOB with sba      Kimmy Parish Cannady-Small, PT, DPT 12:45 PM 05/04/2018  Pager [phone]      Time of Treatment  PT Received On: 05/04/18  Start Time: 1045  Stop Time: 1140  Time Calculation (min): 55 min    Treatment # 1 out of 5 visits

## 2018-05-04 NOTE — Progress Notes (Signed)
Re: CATS Consult     The counselor attempted to check in with the patient for his CATS Consult; however, the patient was sedated and did not respond to counselor's prompts.  The counselor checked in with the patient's guard and he stated that he had just been given his medication and was out.  The counselor will attempt to meet with the patient at a later time to complete the CATS Consult.     Alena Bills, MS, EdS, Resident in Counseling
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
## 2018-05-04 NOTE — Plan of Care (Signed)
Problem: Safety  Goal: Patient will be free from injury during hospitalization  Outcome: Progressing  Flowsheets (Taken 05/03/2018 2000)  Patient will be free from injury during hospitalization : Ensure appropriate safety devices are available at the bedside; Assess patient's risk for falls and implement fall prevention plan of care per policy; Assess for patients risk for elopement and implement Elopement Risk Plan per policy; Provide alternative method of communication if needed (communication boards, writing); Include patient/ family/ care giver in decisions related to safety; Provide and maintain safe environment; Use appropriate transfer methods; Hourly rounding  Goal: Patient will be free from infection during hospitalization  Outcome: Progressing  Flowsheets (Taken 05/03/2018 2000)  Free from Infection during hospitalization: Monitor all insertion sites (i.e. indwelling lines, tubes, urinary catheters, and drains); Assess and monitor for signs and symptoms of infection; Monitor lab/diagnostic results; Encourage patient and family to use good hand hygiene technique     Problem: Pain  Goal: Pain at adequate level as identified by patient  Outcome: Progressing  Flowsheets (Taken 05/03/2018 2000)  Pain at adequate level as identified by patient: Identify patient comfort function goal; Assess for risk of opioid induced respiratory depression, including snoring/sleep apnea. Alert healthcare team of risk factors identified.; Assess pain on admission, during daily assessment and/or before any "as needed" intervention(s); Reassess pain within 30-60 minutes of any procedure/intervention, per Pain Assessment, Intervention, Reassessment (AIR) Cycle; Evaluate if patient comfort function goal is met; Offer non-pharmacological pain management interventions; Consult/collaborate with Physical Therapy, Occupational Therapy, and/or Speech Therapy; Include patient/patient care companion in decisions related to pain management as  needed; Evaluate patient's satisfaction with pain management progress; Consult/collaborate with Pain Service     Problem: Side Effects from Pain Analgesia  Goal: Patient will experience minimal side effects of analgesic therapy  Outcome: Progressing  Flowsheets (Taken 05/03/2018 2000)  Patient will experience minimal side effects of analgesic therapy: Prevent/manage side effects per LIP orders (i.e. nausea, vomiting, pruritus, constipation, urinary retention, etc.); Monitor/assess patient's respiratory status (RR depth, effort, breath sounds); Evaluate for opioid-induced sedation with appropriate assessment tool (i.e. POSS); Assess for changes in cognitive function     Problem: Discharge Barriers  Goal: Patient will be discharged home or other facility with appropriate resources  Outcome: Progressing  Flowsheets (Taken 05/03/2018 2000)  Discharge to home or other facility with appropriate resources: Provide appropriate patient education; Provide information on available health resources; Initiate discharge planning     Problem: Psychosocial and Spiritual Needs  Goal: Demonstrates ability to cope with hospitalization/illness  Outcome: Progressing  Flowsheets (Taken 05/03/2018 2000)  Demonstrates ability to cope with hospitalizations/illness: Encourage verbalization of feelings/concerns/expectations; Encourage patient to set small goals for self; Include patient/ patient care companion in decisions; Communicate referral to spiritual care as appropriate; Encourage participation in diversional activity; Provide quiet environment; Assist patient to identify own strengths and abilities; Reinforce positive adaptation of new coping behaviors     Problem: Moderate/High Fall Risk Score >5  Goal: Patient will remain free of falls  Outcome: Progressing     Problem: Compromised Tissue integrity  Goal: Damaged tissue is healing and protected  Outcome: Progressing  Flowsheets (Taken 05/03/2018 2000)  Damaged tissue is healing and  protected : Increase activity as tolerated/progressive mobility; Monitor/assess Braden scale every shift; Keep intact skin clean and dry; Use incontinence wipes for cleaning urine, stool and caustic drainage. Foley care as needed; Monitor external devices/tubes for correct placement to prevent pressure, friction and shearing; Monitor patient's hygiene practices; Consider placing an indwelling catheter  if incontinence interferes with healing of stage 3 or 4 pressure injury; Consult/collaborate with wound care nurse; Use bath wipes, not soap and water, for daily bathing; Relieve pressure to bony prominences for patients at moderate and high risk; Provide wound care per wound care algorithm; Avoid shearing injuries; Reposition patient every 2 hours and as needed unless able to reposition self  Goal: Nutritional status is improving  Outcome: Progressing  Flowsheets (Taken 05/03/2018 2000)  Nutritional status is improving: Encourage patient to take dietary supplement(s) as ordered; Collaborate with Clinical Nutritionist; Include patient/patient care companion in decisions related to nutrition  Note:   Pt NPO w/ NG tube to R nare connected to LCWS w/ bilious output minimal amount. Pt on continuous tube feeding PIVOT 1.5 @ 35cc/hr tolerating well w/ no residual volume also w/ Q 6hrly water flushes w/ 50cc/hr; flushed also Q 6hrly w/ gastric content as ordered of 30cc Q 6hrly. No N&V noted overnight. Oral care done. On cIVf LR @ 75cc/hr infusing well to L posterior hand 18G.     Received pt a/o x 4, complains of abdominal pain, relieved by prn IV inj dilaudid. NPO, feeding tube in place, tolerating well. Drain care provided, see flowsheets. 30cc bile re-fed as ordered q6h, pt tolerated well. Denies sob, resp distress, chest pain-VS remained stable w/ TMax of  98.1 F/orem. Pt independent with urinal-amber colored urine output noted. Assisted with repositioning q2h. Police officer at bedside throughout shift.
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
## 2018-05-04 NOTE — OT Progress Note (Signed)
Occupational Therapy Note    [facility]   Occupational Therapy Treatment     Patient: Jorge [name]    MRN#: [id number]   Unit: [facility] TOWER 3  Bed: F335/F335.01      Discharge Recommendations:   Discharge Recommendation: Home with supervision, Home with home health OT   DME Recommended for Discharge: (RW, shower chair, reacher)    If Home with supervision, Home with home health OT recommended discharge disposition is not available, patient will need rehab.     Assessment:   Pt received in bed.  Cervical collar in place.  Reviewed spine precautions with pt.  Pt performing bed mobility at CGA/Min A level with cues for logrolling.  Pt able to don socks on bed with HOB elevation ~20* with min a.  Pt requiring mod a to don TLSO brace seated EOB.  Pt performing functional transfers at [facility] level using RW to access bathroom for toileting and sink for grooming.  Pt tolerated session well.  Pt left OOB seated on chair at end of session.  Pt will benefit from continued OT to maximize Independence with ADL and functional transfers/mobility.     Treatment Activities: self care, bed mobility, functional transfers     Educated the patient to role of occupational therapy, plan of care, goals of therapy and safety with mobility and ADLs, spine precautions.    Plan:    OT Frequency Recommended: 3-4x/wk     Continue plan of care.       Precautions and Contraindications:   Precautions  Weight Bearing Status: no restrictions  Neck Brace Applied: yes, at all times  Back Brace Applied: yes  Spinal Precautions: no bending, no twisting, no lifting  Other Precautions: falls, ASPEN AAT, TLSO when OOB; ok to don/doff EOB per NSGY, may shower seated without brace      Updated Medical Status/Imaging/Labs:  Chart reviewed    Subjective:   Patient's medical condition is appropriate for Occupational Therapy intervention at this time.  Patient is agreeable to participation in the therapy session. Nursing clears  patient for therapy.    Pain:   Pain Scale:wong baker faces  Pain Score: 6  Pain Location: abdomen  Pain Intervention: premedicated    Objective:   Patient is in bed with peripheral IV, JP Drain x 2, nasogastric suction, PEG tube, enterostomy/gastrostomy drain x 2 and cervical collar in place.    Cognition  Alert, able to recall 0/3 spine precautions.  3/3 spine precautions and pt verbalized understanding.     Functional Mobility  Rolling:  CGA to left   Supine to Sit: min a   Sit to Stand: CGA  Transfers: CGA    PMP Activity: Step 7 - Walks out of Room    Balance  Static Sitting: good  Static Standing: good   Dynamic Standing: good with RW    Self Care and Home Management  Eating: npo  Grooming: supervision for handwashing while standing at sink  Bathing: nt  UE Dressing: mod a to don TLSO while seated EOB  LE Dressing: min a to don socks supine on bed   Toileting: SBA while standing for use of urinal at toilet    Therapeutic Exercises  With activity    Participation: good  Endurance: good    Patient left with call bell within reach, all needs met, SCDs off, fall mat in place, bed alarm off, chair alarm on and all questions answered. RN notified of session outcome  and patient response.     Goals:  Time For Goal Achievement: 5 visits  ADL Goals  Patient will dress upper body: Supervision(don/doff TLSO EOB)  Patient will dress lower body: Modified Independent  Pt will complete bathing: Supervision  Patient will toilet: Supervision  Mobility and Transfer Goals  Pt will perform functional transfers: Supervision      Time of Treatment  OT Received On: 05/04/18  Start Time: 1058  Stop Time: 1150  Time Calculation (min): 52 min    Treatment # 1 of 5 visits  Mary Sella, OTR/L  Pager [phone]
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
## 2018-05-05 DIAGNOSIS — S12500A Unspecified displaced fracture of sixth cervical vertebra, initial encounter for closed fracture: Secondary | ICD-10-CM | POA: Diagnosis present

## 2018-05-05 DIAGNOSIS — W540XXA Bitten by dog, initial encounter: Secondary | ICD-10-CM | POA: Diagnosis present

## 2018-05-05 DIAGNOSIS — S32009A Unspecified fracture of unspecified lumbar vertebra, initial encounter for closed fracture: Secondary | ICD-10-CM | POA: Diagnosis present

## 2018-05-05 DIAGNOSIS — Q439 Congenital malformation of intestine, unspecified: Secondary | ICD-10-CM

## 2018-05-05 DIAGNOSIS — S22089A Unspecified fracture of T11-T12 vertebra, initial encounter for closed fracture: Secondary | ICD-10-CM | POA: Diagnosis present

## 2018-05-05 DIAGNOSIS — G8911 Acute pain due to trauma: Secondary | ICD-10-CM | POA: Diagnosis present

## 2018-05-05 DIAGNOSIS — Q438 Other specified congenital malformations of intestine: Secondary | ICD-10-CM

## 2018-05-05 DIAGNOSIS — E46 Unspecified protein-calorie malnutrition: Secondary | ICD-10-CM | POA: Diagnosis present

## 2018-05-05 LAB — CBC
Absolute NRBC: 0 10*3/uL (ref 0.00–0.00)
Hematocrit: 27.3 % — ABNORMAL LOW (ref 37.6–49.6)
Hgb: 9.1 g/dL — ABNORMAL LOW (ref 12.5–17.1)
MCH: 29.7 pg (ref 25.1–33.5)
MCHC: 33.3 g/dL (ref 31.5–35.8)
MCV: 89.2 fL (ref 78.0–96.0)
MPV: 10.4 fL (ref 8.9–12.5)
Nucleated RBC: 0 /100 WBC (ref 0.0–0.0)
Platelets: 298 10*3/uL (ref 142–346)
RBC: 3.06 10*6/uL — ABNORMAL LOW (ref 4.20–5.90)
RDW: 13 % (ref 11–15)
WBC: 6.2 10*3/uL (ref 3.10–9.50)

## 2018-05-05 LAB — BASIC METABOLIC PANEL
BUN: 12 mg/dL (ref 9.0–28.0)
CO2: 24 mEq/L (ref 22–29)
Calcium: 8.7 mg/dL (ref 7.9–10.2)
Chloride: 108 mEq/L (ref 100–111)
Creatinine: 0.8 mg/dL (ref 0.7–1.3)
Glucose: 112 mg/dL — ABNORMAL HIGH (ref 70–100)
Potassium: 4 mEq/L (ref 3.5–5.1)
Sodium: 140 mEq/L (ref 136–145)

## 2018-05-05 LAB — GFR: EGFR: >60

## 2018-05-05 LAB — MAGNESIUM: Magnesium: 2 mg/dL (ref 1.6–2.6)

## 2018-05-05 MED ORDER — ENOXAPARIN SODIUM 40 MG/0.4ML SC SOLN
40.00 mg | Freq: Every day | SUBCUTANEOUS | Status: DC
Start: 2018-05-05 — End: 2018-05-19
  Administered 2018-05-05 – 2018-05-19 (×15): 40 mg via SUBCUTANEOUS
  Filled 2018-05-05 (×16): qty 0.4

## 2018-05-05 NOTE — Consults (Signed)
Drug Abuse Screening Test - DAST-10   These Questions Refer to the Past 12 Months     1 Have you used drugs other than those required for medical reasons? Yes   2 Do you abuse more than one drug at a time? Yes   3 Are you unable to stop using drugs when you want to? Yes   4 Have you ever had blackouts or flashbacks as a result of drug use? No     5 Do you ever feel bad or guilty about your drug use? Yes   6 Does your spouse/partner/parents ever complain about your involvement with drugs? Yes   7 Have you neglected your family because of your use of drugs? Yes   8 Have you engaged in illegal activities in order to obtain drugs? No     9 Have you ever experienced withdrawal symptoms (felt sick) when you stopped taking drugs? No     10 Have you had medical problems as a result of your drug use (eg. memory loss, seizures, bleeding, hepatitis, coughing, chest irritation and bronchitis)? Yes    TOTAL (number of questions answered "Yes"): 7    DAST-10 Outcome: 6+ (Severe, Referral to treatment)     Guidelines for Interpretation of DAST-10   Score Degree of Problems Related to Drug Abuse Suggested Action   0 None Encouragement and Education   1-2 Low Risky behavior - feedback and advice   3-5 Moderate Harmful behavior - feedback and counseling: possible referral for specialized assessment   6+ Severe Intensive assessment and referral           The patient completed a DAST screening tool today and the total score suggests: An increased risk of health problems related to substance use and a possible moderate or severe substance use disorder    Pt initially asked counselor to "come back later" when consult was initiated in the morning but then agreed to speak to T/W in the afternoon on second attempt.  Pt presented as willing to engage with soft speech, constricted affect and depressed mood. Pt reported that he drinks "sometimes" but does not feel his drinking is a problem. Pt reported that he has been using marijuana and  abusing adderall that was prescribed to him "taking more that I should."  Pt reported that he wants to stop both the marijuana and Adderall abuse stating that the marijuana "makes me unmotivated and paranoid" and "my brain is slow as hell. I can't focus."    In discussing this issue my advice was that the patient: Abstain    The patient agreed to: Accept a referral to [facility] and other community resources for treatment.  Pt stated that he did not think he needed treatment saying "I know I'm not going to use again after this." Pt was given brief education about the role of treatment and how it can be helpful as well as sober support networks.    The patient's readiness to change was 10 on a scale of 0-10.  Pt rated his confidence that he could make those changes as 10/10. Pt identified that staying away from others who use marijuana will help him reach his goal.  Pt was affirmed for his desire to make changes and encouraged to reach out for resources that will be helpful to him including hospital social worker.     Total time administering and interpreting the screening form, plus performing a face-to-face brief intervention with the patient was 15 to 30 (CPT  [phone] can be billed) minutes.    Willette Pa, MA, [facility]  Behavioral Health Program Therapist

## 2018-05-05 NOTE — Plan of Care (Signed)
Problem: Safety  Goal: Patient will be free from injury during hospitalization  Outcome: Progressing     Problem: Pain  Goal: Pain at adequate level as identified by patient  Outcome: Progressing     Problem: Discharge Barriers  Goal: Patient will be discharged home or other facility with appropriate resources  Outcome: Progressing     Problem: Moderate/High Fall Risk Score >5  Goal: Patient will remain free of falls  Outcome: Progressing     Problem: Compromised Tissue integrity  Goal: Damaged tissue is healing and protected  Outcome: Progressing     Problem: Compromised Tissue integrity  Goal: Nutritional status is improving  Outcome: Progressing     Problem: Altered GI Function  Goal: Nutritional intake is adequate  Outcome: Progressing
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
## 2018-05-05 NOTE — Consults (Signed)
Nutrition Follow-Up:     Recommendations:   1) Continue to increase to goal of Pivot 1.5 @60  ml/hr (increase by 10 ml q 6 hours) - 1440 ml total daily   Goal tube feeding regimen provides 2160 kcals, 98 g protein, 1100 ml free water  2) Water flushes per team or 175 ml q 4 hours to provides ~49ml/kcal      Clinical Progress/Update:   Consulted for "caloric needs, J tube feeds."  Pt curerntly receiving Pivot 1.5 @ 40 ml/hr with water flushes of 50 ml q 6 hours.     Current Diet Order  Orders Placed This Encounter   Procedures   . Diet NPO effective now   . Tube feeding-CONTINUOUS       GI: soft, tender, last bm 9/25  Edema: none documented  Skin: dog bite  Pertinent Labs: reviewed  Pertinent Meds: dilaudid, zofran    Estimated Nutr Needs:               1850-2150 kcals (30-35 kcal/kg)              95-115gm protein (1.5-1.8gm/kg)              Fluids per team    Intervention:   1) Continue to increase to goal of Pivot 1.5 @60  ml/hr (increase by 10 ml q 6 hours) - 1440 ml total daily   Goal tube feeding regimen provides 2160 kcals, 98 g protein, 1100 ml free water  2) Water flushes per team or 175 ml q 4 hours to provides ~3ml/kcal    Monitoring/Evaluation:   Labs, meds, GI, Nutrition support goals, med tx plan, plan of care    [place], [place]  Spectra [phone]

## 2018-05-05 NOTE — Plan of Care (Signed)
Problem: Safety  Goal: Patient will be free from injury during hospitalization  Outcome: Progressing     Problem: Pain  Goal: Pain at adequate level as identified by patient  Outcome: Progressing     Problem: Discharge Barriers  Goal: Patient will be discharged home or other facility with appropriate resources  Outcome: Progressing     Problem: Moderate/High Fall Risk Score >5  Goal: Patient will remain free of falls  Outcome: Progressing     Problem: Altered GI Function  Goal: Fluid and electrolyte balance are achieved/maintained  Outcome: Progressing     Problem: Altered GI Function  Goal: No bleeding  Outcome: Progressing

## 2018-05-06 ENCOUNTER — Inpatient Hospital Stay: Payer: No Typology Code available for payment source

## 2018-05-06 DIAGNOSIS — R000 Tachycardia, unspecified: Secondary | ICD-10-CM

## 2018-05-06 DIAGNOSIS — R0602 Shortness of breath: Secondary | ICD-10-CM

## 2018-05-06 LAB — CBC AND DIFFERENTIAL
Absolute NRBC: 0 10*3/uL (ref 0.00–0.00)
Basophils Absolute Automated: 0.02 10*3/uL (ref 0.00–0.08)
Basophils Automated: 0.2 %
Eosinophils Absolute Automated: 0.13 10*3/uL (ref 0.00–0.44)
Eosinophils Automated: 1.4 %
Hematocrit: 33.6 % — ABNORMAL LOW (ref 37.6–49.6)
Hgb: 10.9 g/dL — ABNORMAL LOW (ref 12.5–17.1)
Immature Granulocytes Absolute: 0.09 10*3/uL — ABNORMAL HIGH (ref 0.00–0.07)
Immature Granulocytes: 1 %
Lymphocytes Absolute Automated: 1.17 10*3/uL (ref 0.42–3.22)
Lymphocytes Automated: 12.6 %
MCH: 29 pg (ref 25.1–33.5)
MCHC: 32.4 g/dL (ref 31.5–35.8)
MCV: 89.4 fL (ref 78.0–96.0)
MPV: 10.5 fL (ref 8.9–12.5)
Monocytes Absolute Automated: 0.73 10*3/uL (ref 0.21–0.85)
Monocytes: 7.8 %
Neutrophils Absolute: 7.18 10*3/uL — ABNORMAL HIGH (ref 1.10–6.33)
Neutrophils: 77 %
Nucleated RBC: 0 /100 WBC (ref 0.0–0.0)
Platelets: 406 10*3/uL — ABNORMAL HIGH (ref 142–346)
RBC: 3.76 10*6/uL — ABNORMAL LOW (ref 4.20–5.90)
RDW: 13 % (ref 11–15)
WBC: 9.32 10*3/uL (ref 3.10–9.50)

## 2018-05-06 LAB — COMPREHENSIVE METABOLIC PANEL
ALT: 76 U/L — ABNORMAL HIGH (ref 0–55)
AST (SGOT): 72 U/L — ABNORMAL HIGH (ref 5–34)
Albumin/Globulin Ratio: 0.9 (ref 0.9–2.2)
Albumin: 3.6 g/dL (ref 3.5–5.0)
Alkaline Phosphatase: 73 U/L (ref 38–106)
BUN: 21 mg/dL (ref 9.0–28.0)
Bilirubin, Total: 0.9 mg/dL (ref 0.2–1.2)
CO2: 25 mEq/L (ref 22–29)
Calcium: 9.7 mg/dL (ref 7.9–10.2)
Chloride: 106 mEq/L (ref 100–111)
Creatinine: 0.9 mg/dL (ref 0.7–1.3)
Globulin: 3.8 g/dL — ABNORMAL HIGH (ref 2.0–3.6)
Glucose: 115 mg/dL — ABNORMAL HIGH (ref 70–100)
Potassium: 4.3 mEq/L (ref 3.5–5.1)
Protein, Total: 7.4 g/dL (ref 6.0–8.3)
Sodium: 143 mEq/L (ref 136–145)

## 2018-05-06 LAB — GFR: EGFR: >60

## 2018-05-06 MED ORDER — LACTATED RINGERS IV BOLUS
1000.00 mL | Freq: Once | INTRAVENOUS | Status: AC
Start: 2018-05-06 — End: 2018-05-06
  Administered 2018-05-06: 20:00:00 1000 mL via INTRAVENOUS

## 2018-05-06 MED ORDER — IOHEXOL 350 MG/ML IV SOLN
100.00 mL | Freq: Once | INTRAVENOUS | Status: AC | PRN
Start: 2018-05-06 — End: 2018-05-06
  Administered 2018-05-06: 13:00:00 100 mL via INTRAVENOUS

## 2018-05-06 NOTE — Plan of Care (Signed)
AOx4, c/o back, abdominal pain managed with PRN IV 0.5 mg dilaudid. Able to make needs known. C/o difficulty breathing early this AM. VSS,. MAE, OOB with 1 assist. Scheduled meds given per MD order. 1:1 guard in place. Tubefeeding running pivot 1.5 @ 60 ml/hr with 175 ml q4h water flushes. All drains flushed with 10 ml sterile saline around 1300. 30 cc bile re fed from G tube into feeding J tube this shift around 1300. Fall precautions in place. Call bell within reach. Will continue to monitor for safety.  Problem: Safety  Goal: Patient will be free from injury during hospitalization  Outcome: Progressing  Flowsheets (Taken 05/03/2018 2000 by Dennison Mascot, RN)  Patient will be free from injury during hospitalization : Ensure appropriate safety devices are available at the bedside;Assess patient's risk for falls and implement fall prevention plan of care per policy;Assess for patients risk for elopement and implement Elopement Risk Plan per policy;Provide alternative method of communication if needed (communication boards, writing);Include patient/ family/ care giver in decisions related to safety;Provide and maintain safe environment;Use appropriate transfer methods;Hourly rounding     Problem: Pain  Goal: Pain at adequate level as identified by patient  Outcome: Progressing  Flowsheets (Taken 05/03/2018 2000 by Dennison Mascot, RN)  Pain at adequate level as identified by patient: Identify patient comfort function goal;Assess for risk of opioid induced respiratory depression, including snoring/sleep apnea. Alert healthcare team of risk factors identified.;Assess pain on admission, during daily assessment and/or before any "as needed" intervention(s);Reassess pain within 30-60 minutes of any procedure/intervention, per Pain Assessment, Intervention, Reassessment (AIR) Cycle;Evaluate if patient comfort function goal is met;Offer non-pharmacological pain management interventions;Consult/collaborate with Physical Therapy,  Occupational Therapy, and/or Speech Therapy;Include patient/patient care companion in decisions related to pain management as needed;Evaluate patient's satisfaction with pain management progress;Consult/collaborate with Pain Service     Problem: Altered GI Function  Goal: Fluid and electrolyte balance are achieved/maintained  Outcome: Progressing  Flowsheets (Taken 05/04/2018 1455 by Ardelle Lesches, RN)  Fluid and electrolyte balance are achieved/maintained: Monitor intake and output every shift;Monitor/assess lab values and report abnormal values;Provide adequate hydration;Assess for confusion/personality changes;Monitor daily weight;Assess and reassess fluid and electrolyte status;Observe for seizure activity and initiate seizure precautions if indicated;Monitor for muscle weakness;Observe for cardiac arrhythmias

## 2018-05-06 NOTE — Plan of Care (Signed)
Problem: Safety  Goal: Patient will be free from injury during hospitalization  Outcome: Progressing       Problem: Pain  Goal: Pain at adequate level as identified by patient  Outcome: Progressing     Problem: Moderate/High Fall Risk Score >5  Goal: Patient will remain free of falls  Outcome: Progressing     Problem: Compromised Tissue integrity  Goal: Nutritional status is improving  Outcome: Progressing     Problem: Altered GI Function  Goal: Fluid and electrolyte balance are achieved/maintained  Outcome: Progressing     Problem: Altered GI Function  Goal: Elimination patterns are normal or improving  Outcome: Progressing     Problem: Altered GI Function  Goal: Mobility/Activity is maintained at optimal level for patient  Outcome: Progressing

## 2018-05-06 NOTE — OT Progress Note (Signed)
Occupational Therapy Note    [facility]   Occupational Therapy Treatment     Patient: Jorge [name]    MRN#: [id number]   Unit: [facility] TOWER 3  Bed: F335/F335.01      Discharge Recommendations:   Discharge Recommendation: Home with supervision, Home with home health OT   DME Recommended for Discharge: (RW, shower chair, reacher)    IfHome with supervision, Home with home health OTrecommended discharge disposition is not available, patient will need rehab.  Assessment:   Pt received in bed with Aspen collar intact.  Pt performing logrolling and sidelying to sit transfer with SBA and cues in prep for EOB level ADL. Pt able to don TLSO brace with min A while seated EOB.  Pt able to complete LB dressing supine on bed with set up to don both socks.  Pt performing functional transfers at CGA/SBA level.  Pt ambulating household distances with CGA using IV pole for support.  Pt with mild c/o dizziness and c/o nausea once to hallway, therefore pt instructed to return to room.  RN notified.  Pt left seated in chair at end of session.  Will follow up for 1 additional OT session and then anticipate pt will meet OT goals.     Treatment Activities: self care, functional transfers, bed mobility    Educated the patient to role of occupational therapy, plan of care, goals of therapy and safety with mobility and ADLs.    Plan:    OT Frequency Recommended: 3-4x/wk     Continue plan of care.       Precautions and Contraindications:   Fall, multiple drains     Updated Medical Status/Imaging/Labs:  Chart reviewed    Subjective:"This hand is numb" (R hand; no c/o numbness end of session after repositioning)   Patient's medical condition is appropriate for Occupational Therapy intervention at this time.  Patient is agreeable to participation in the therapy session. Nursing clears patient for therapy.    Pain:   Denies pain    Objective:   Patient is in bed with Aspen collar, NGT, PIV, JP drain x 2, Gtube  in place.       Functional Mobility  Rolling:  SBA for logrolling on bed   Supine to Sit: SBA and cues   Sit to Stand: Supervision  Transfers: CGA using IV pole for support    PMP Activity: Step 7 - Walks out of Room    Balance  Static Sitting: good  Dynamic Sitting: good  Static Standing: good   Dynamic Standing: good-    Self Care and Home Management  Eating: npo  Grooming: set up to use toothette to swab mouth  Bathing: nt  UE Dressing: min a to don TLSO EOB level   LE Dressing: set up to don socks supine on bed   Toileting: nt    Participation: good  Endurance: good    Patient left with call bell within reach, all needs met, SCDs off, fall mat in place, bed alarm off, chair alarm on and all questions answered. RN notified of session outcome and patient response.     Goals:  Time For Goal Achievement: 5 visits  ADL Goals  Patient will dress upper body: Supervision(don/doff TLSO EOB)  Patient will dress lower body: Modified Independent  Pt will complete bathing: Supervision  Patient will toilet: Supervision  Mobility and Transfer Goals  Pt will perform functional transfers: Supervision    Time of Treatment  OT  Received On: 05/06/18  Start Time: 1400  Stop Time: 1430  Time Calculation (min): 30 min    Treatment # 2 of 5 visits    Mary Sella, OTR/L  Pager [phone]
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
## 2018-05-06 NOTE — Progress Notes (Addendum)
Patient with increasing tachycardia to 100s, rising Na/free water deficit, rising BUN/Cr ratio, hemoconcentration, dizzy when standing and decreased UOP today (two voids unmeasured all day pt reports minimal and dark urine with each report). CTc/a/p unremarkable earlier today. 1L bolus ordered, FWF with tube feeds was increased earlier today.     Of note, while reviewing CT scan noted that both J tube balloons were inflated. These should be deflated to allow bile/pancreatic enzymes to travel down stream. Saline was removed from both balloons. The G tube balloon remains fully inflated and should remain as such.     Loetta Rough, DO  General Surgery Resident  Pager Number: [phone]      Addendum: discussed patient with Dr. Alesia Banda. Will increase bile refeeding to q6h from 30ml q6h.     Loetta Rough, DO  General Surgery Resident  Pager Number: [phone]

## 2018-05-07 LAB — CBC
Absolute NRBC: 0 10*3/uL (ref 0.00–0.00)
Hematocrit: 32.2 % — ABNORMAL LOW (ref 37.6–49.6)
Hgb: 10.3 g/dL — ABNORMAL LOW (ref 12.5–17.1)
MCH: 29.1 pg (ref 25.1–33.5)
MCHC: 32 g/dL (ref 31.5–35.8)
MCV: 91 fL (ref 78.0–96.0)
MPV: 10.1 fL (ref 8.9–12.5)
Nucleated RBC: 0 /100 WBC (ref 0.0–0.0)
Platelets: 412 10*3/uL — ABNORMAL HIGH (ref 142–346)
RBC: 3.54 10*6/uL — ABNORMAL LOW (ref 4.20–5.90)
RDW: 13 % (ref 11–15)
WBC: 8.51 10*3/uL (ref 3.10–9.50)

## 2018-05-07 LAB — BASIC METABOLIC PANEL
BUN: 21 mg/dL (ref 9.0–28.0)
CO2: 27 mEq/L (ref 22–29)
Calcium: 9.3 mg/dL (ref 7.9–10.2)
Chloride: 105 mEq/L (ref 100–111)
Creatinine: 0.8 mg/dL (ref 0.7–1.3)
Glucose: 98 mg/dL (ref 70–100)
Potassium: 4 mEq/L (ref 3.5–5.1)
Sodium: 142 mEq/L (ref 136–145)

## 2018-05-07 LAB — GFR: EGFR: >60

## 2018-05-07 LAB — MAGNESIUM: Magnesium: 2.2 mg/dL (ref 1.6–2.6)
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
## 2018-05-07 NOTE — PT Progress Note (Signed)
[facility]   Physical Therapy Treatment  Patient:  Jorge [name] MRN#:  [id number]  Unit: [facility] TOWER 3  Bed: F335/F335.01    Discharge Recommendations:   D/C Recommendations: Home with supervision, Home with home health PT   DME Recommendations: (TBD)     If Home with supervision, Home with home health PT recommended discharge disposition is not available, patient will need Rehab.    Recommendations can change; please see most recent physical therapy treatment note for updates.     Assessment:   Patient recd in chair and agreeable to PT. C/o mild nausea and dizziness throughout. Improved gait and transfers with CGA-Min A. Verbal/tactile cues for postural awareness and safety. Intermittent standing rest breaks d/t fatigue and SOB. Continue to progress as tolerated.    Treatment Activities:   Transfers, gait    Educated the patient to role of physical therapy, plan of care, goals of therapy and HEP, safety with mobility and ADLs, energy conservation techniques, spine precautions, [place] plans and recs.    Plan:   Treatment/Interventions: Exercise, Gait training, Stair training, Neuromuscular re-education, Compensatory technique education, Bed mobility, Equipment eval/education, Patient/family training, Endurance training, LE strengthening/ROM, Functional transfer training        PT Frequency: 3-4x/wk     Continue plan of care.       Precautions and Contraindications:   Fall Risk  TLSO when OOB, don at EOB  Spine precautions  Multi drains  PEG    Admitting Medical Diagnosis:   Laceration of liver, initial encounter [S36.113A]  Liver laceration [S36.113A]      Updated Medical Status/Imaging/Labs:   No results found.  Lab Results   Component Value Date/Time    HGB 10.3 (L) 05/07/2018 03:18 AM    HCT 32.2 (L) 05/07/2018 03:18 AM    K 4.0 05/07/2018 03:18 AM    NA 142 05/07/2018 03:18 AM    INR 1.6 (H) 05/01/2018 01:07 AM         Subjective:   "I feel a tiny bit better"    Pain  Assessment:  Pain Scale: wong baker faces  Pain Score: 4  Pain Location: abdomen and back  Pain Intervention: medicated      Patient's medical condition is appropriate for Physical Therapy intervention at this time.  Patient is agreeable to participation in the therapy session. Nursing clears patient for therapy.    Objective:   Patient is seated in a bedside chair with telemetry, peripheral IV, JP Drain and TLSO multi abd drains in place.    Vitals:  stable    Cognition/Neuro Status:  Orientation Level: alert and oriented  Behavior: cooperative  Safety awareness: educated      Functional Mobility/Transfers:  Sit to Stand: CGA  Stand to Sit: CGA  Bed to Chair: CGA  Stand Pivot: CGA      Ambulation:  PMP Activity: Step 7 - Walks out of Room  Distance: 500 feet x 1, 250 feet x 1  Assist: CGA-Min A  Assistive Device: HHA  Pattern: decreased cadence and step length, flexed posture  Number of Stairs: nt      Therapeutic Exercise:  During activity      Patient Participation: good  Patient Endurance: fair+      Patient left with call bell within reach, all needs met, and all questions answered. RN notified of session outcome and patient response. Officer present throughout    SCD: na  Fall mat: in place  Bed alarm:  na  Chair alarm: on   Avasys: na      Goals:  Goals  Goal Formulation: With patient  Time for Goal Acheivement: 5 visits  Pt Will Go Supine To Sit: modified independent, with supervision  Pt Will Perform Sit To Supine: modified independent, with supervision  Pt Will Perform Sit to Stand: modified independent, with supervision  Pt Will Transfer Bed/Chair: modified independent, with supervision  Pt Will Ambulate: > 200 feet, with supervision, with stand by assist  Pt Will Go Up / Down Stairs: 1 flight, With rail, with stand by assist  Other Goal: Pt will safely don/doff TLSO at EOB with sba      Ladarrell Cornwall Cannady-Small, PT, DPT 5:15 PM 05/07/2018  Pager [phone]      Time of Treatment  PT Received On: 05/07/18  Start  Time: 1355  Stop Time: 1425  Time Calculation (min): 30 min    Treatment # 2 out of 5 visits
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
## 2018-05-07 NOTE — OT Progress Note (Signed)
Occupational Therapy Note    [facility]   Occupational Therapy Treatment     Patient: Jorge [name]    MRN#: [id number]   Unit: [facility] TOWER 3  Bed: F335/F335.01      Discharge Recommendations:   Discharge Recommendation: Home with supervision, Home with home health OT   DME Recommended for Discharge: (RW, shower chair, reacher)    Assessment:   Pt progressing towards goals.  Pt able to complete UB/LB sponge bathing and LB dressing supine position on bed at SBA level.  CGA for sidelying to sit EOB to don TLSO brace with min a.   Pt ambulated to bathroom for toileting with Supervision.  Pt completed performing transfers on/off toilet at SBA level using grab bars for support.  OT goals close to met.  Continue OT to maximize Independence with ADL and functional transfers/mobility.     Treatment Activities: self care, functional transfers    Educated the patient to role of occupational therapy, plan of care, goals of therapy and safety with mobility and ADLs, spine precautions.    Plan:    OT Frequency Recommended: 3-4x/wk     Continue plan of care.       Precautions and Contraindications:   Fall   Spine precautions  TLSO brace (may don/doff EOB)  Aspen AAT      Updated Medical Status/Imaging/Labs:  Chart reviewed    Subjective:   Patient's medical condition is appropriate for Occupational Therapy intervention at this time.  Patient is agreeable to participation in the therapy session. Nursing clears patient for therapy.    Pain:   Denies pain  Reports R hand middle and ring finger numbness/tingling sensation    Objective:   Patient is in bed with Aspen collar, PIV access, JP drain x 2, Gtube in place.     Cognition  Alert and following instruction appropriately    Functional Mobility  Rolling:  Mod I  Supine to Sit: cga  Sit to Stand: supervision  Transfers: supervision    PMP Activity: Step 6 - Walks in Room    Balance  Static Sitting: good  Dynamic Sitting: good  Static Standing:  good  Dynamic Standing: good    Self Care and Home Management  Eating: npo  Grooming: set up  Bathing: sba for UB/LB sponge bathing on bed  UE Dressing: min a for back brace  LE Dressing: sba to don socks and pants while supine on bed   Toileting: supervision    Participation: good  Endurance: good    Patient left with call bell within reach, all needs met, SCDs off, fall mat in place, bed alarm off, chair alarm in place and all questions answered. RN notified of session outcome and patient response.     Goals:  Time For Goal Achievement: 5 visits  ADL Goals  Patient will dress upper body: Supervision(don/doff TLSO EOB)  Patient will dress lower body: Modified Independent  Pt will complete bathing: Supervision  Patient will toilet: Supervision-Goal met  Mobility and Transfer Goals  Pt will perform functional transfers: Supervision    Time of Treatment  OT Received On: 05/07/18  Start Time: 1322  Stop Time: 1405  Time Calculation (min): 43 min    Treatment # 3 of 5 visits  Mary Sella, OTR/L  Pager [phone]
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
## 2018-05-08 MED ORDER — HYDROMORPHONE HCL 1 MG/ML IJ SOLN
1.00 mg | Freq: Once | INTRAMUSCULAR | Status: AC
Start: 2018-05-08 — End: 2018-05-08
  Administered 2018-05-08: 03:00:00 1 mg via INTRAVENOUS

## 2018-05-08 MED ORDER — KETOROLAC TROMETHAMINE 30 MG/ML IJ SOLN
15.00 mg | Freq: Three times a day (TID) | INTRAMUSCULAR | Status: DC | PRN
Start: 2018-05-08 — End: 2018-05-08

## 2018-05-08 MED ORDER — CYCLOBENZAPRINE HCL 10 MG PO TABS
5.00 mg | ORAL_TABLET | Freq: Three times a day (TID) | ORAL | Status: DC | PRN
Start: 2018-05-08 — End: 2018-05-12

## 2018-05-08 MED ORDER — ACETAMINOPHEN 160 MG/5ML PO SOLN
650.00 mg | Freq: Four times a day (QID) | ORAL | Status: DC
Start: 2018-05-09 — End: 2018-05-17
  Administered 2018-05-09 – 2018-05-16 (×21): 650 mg via ORAL
  Filled 2018-05-08 (×31): qty 20.3

## 2018-05-08 MED ORDER — OXYCODONE HCL 5 MG/5ML PO SOLN
10.00 mg | ORAL | Status: DC | PRN
Start: 2018-05-08 — End: 2018-05-14
  Administered 2018-05-08 – 2018-05-14 (×27): 10 mg via JEJUNOSTOMY
  Filled 2018-05-08 (×22): qty 10
  Filled 2018-05-08: qty 5
  Filled 2018-05-08 (×6): qty 10

## 2018-05-08 MED ORDER — ACETAMINOPHEN 10 MG/ML IV SOLN
1000.00 mg | Freq: Three times a day (TID) | INTRAVENOUS | Status: AC
Start: 2018-05-08 — End: 2018-05-08
  Administered 2018-05-08 (×3): 1000 mg via INTRAVENOUS
  Filled 2018-05-08 (×3): qty 100

## 2018-05-08 MED ORDER — KETOROLAC TROMETHAMINE 30 MG/ML IJ SOLN
15.00 mg | Freq: Four times a day (QID) | INTRAMUSCULAR | Status: DC | PRN
Start: 2018-05-08 — End: 2018-05-08

## 2018-05-08 MED ORDER — OXYCODONE HCL 5 MG/5ML PO SOLN
5.00 mg | ORAL | Status: DC | PRN
Start: 2018-05-08 — End: 2018-05-14
  Administered 2018-05-10 – 2018-05-14 (×4): 5 mg via JEJUNOSTOMY
  Filled 2018-05-08 (×3): qty 5
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
## 2018-05-08 NOTE — Plan of Care (Signed)
Problem: Safety  Goal: Patient will be free from injury during hospitalization  Outcome: Progressing  Flowsheets (Taken 05/03/2018 2000 by Dennison Mascot, RN)  Patient will be free from injury during hospitalization : Ensure appropriate safety devices are available at the bedside;Assess patient's risk for falls and implement fall prevention plan of care per policy;Assess for patients risk for elopement and implement Elopement Risk Plan per policy;Provide alternative method of communication if needed (communication boards, writing);Include patient/ family/ care giver in decisions related to safety;Provide and maintain safe environment;Use appropriate transfer methods;Hourly rounding
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
## 2018-05-09 MED ORDER — GABAPENTIN 50 MG/ML UNIT DOSE
100.00 mg | Freq: Three times a day (TID) | ORAL | Status: DC | PRN
Start: 2018-05-09 — End: 2018-05-10
  Administered 2018-05-09: 16:00:00 100 mg via ORAL
  Filled 2018-05-09: qty 6

## 2018-05-09 MED ORDER — GABAPENTIN 100 MG PO CAPS
100.00 mg | ORAL_CAPSULE | Freq: Three times a day (TID) | ORAL | Status: DC | PRN
Start: 2018-05-09 — End: 2018-05-09
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
## 2018-05-10 ENCOUNTER — Inpatient Hospital Stay: Payer: No Typology Code available for payment source

## 2018-05-10 LAB — GLUCOSE WHOLE BLOOD - POCT
Whole Blood Glucose POCT: 109 mg/dL — ABNORMAL HIGH (ref 70–100)
Whole Blood Glucose POCT: 127 mg/dL — ABNORMAL HIGH (ref 70–100)
Whole Blood Glucose POCT: 127 mg/dL — ABNORMAL HIGH (ref 70–100)
Whole Blood Glucose POCT: 131 mg/dL — ABNORMAL HIGH (ref 70–100)
Whole Blood Glucose POCT: 138 mg/dL — ABNORMAL HIGH (ref 70–100)
Whole Blood Glucose POCT: 143 mg/dL — ABNORMAL HIGH (ref 70–100)
Whole Blood Glucose POCT: 151 mg/dL — ABNORMAL HIGH (ref 70–100)
Whole Blood Glucose POCT: 160 mg/dL — ABNORMAL HIGH (ref 70–100)
Whole Blood Glucose POCT: 237 mg/dL — ABNORMAL HIGH (ref 70–100)
Whole Blood Glucose POCT: 309 mg/dL — ABNORMAL HIGH (ref 70–100)
Whole Blood Glucose POCT: 32 mg/dL — CL (ref 70–100)
Whole Blood Glucose POCT: 76 mg/dL (ref 70–100)
Whole Blood Glucose POCT: 89 mg/dL (ref 70–100)
Whole Blood Glucose POCT: 99 mg/dL (ref 70–100)
Whole Blood Glucose POCT: <10 mg/dL — CL (ref 70–100)
Whole Blood Glucose POCT: <10 mg/dL — CL (ref 70–100)

## 2018-05-10 LAB — ECG 12-LEAD
Atrial Rate: 133 {beats}/min
Atrial Rate: 133 {beats}/min
P Axis: 82 degrees
P Axis: 81 degrees
P-R Interval: 132 ms
P-R Interval: 130 ms
Q-T Interval: 300 ms
Q-T Interval: 302 ms
QRS Duration: 84 ms
QRS Duration: 84 ms
QTC Calculation (Bezet): 446 ms
QTC Calculation (Bezet): 449 ms
R Axis: 97 degrees
R Axis: 106 degrees
T Axis: 62 degrees
T Axis: 65 degrees
Ventricular Rate: 133 {beats}/min
Ventricular Rate: 133 {beats}/min

## 2018-05-10 LAB — BASIC METABOLIC PANEL
BUN: 39 mg/dL — ABNORMAL HIGH (ref 9.0–28.0)
BUN: 31 mg/dL — ABNORMAL HIGH (ref 9.0–28.0)
CO2: 28 mEq/L (ref 22–29)
CO2: 28 mEq/L (ref 22–29)
Calcium: 10.6 mg/dL — ABNORMAL HIGH (ref 7.9–10.2)
Calcium: 9.2 mg/dL (ref 7.9–10.2)
Chloride: 97 mEq/L — ABNORMAL LOW (ref 100–111)
Chloride: 103 mEq/L (ref 100–111)
Creatinine: 1.1 mg/dL (ref 0.7–1.3)
Creatinine: 0.8 mg/dL (ref 0.7–1.3)
Glucose: 112 mg/dL — ABNORMAL HIGH (ref 70–100)
Glucose: 36 mg/dL — CL (ref 70–100)
Potassium: 6.1 mEq/L (ref 3.5–5.1)
Potassium: 3.7 mEq/L (ref 3.5–5.1)
Sodium: 136 mEq/L (ref 136–145)
Sodium: 142 mEq/L (ref 136–145)

## 2018-05-10 LAB — CBC
Absolute NRBC: 0 10*3/uL (ref 0.00–0.00)
Hematocrit: 39.4 % (ref 37.6–49.6)
Hgb: 12.4 g/dL — ABNORMAL LOW (ref 12.5–17.1)
MCH: 28.4 pg (ref 25.1–33.5)
MCHC: 31.5 g/dL (ref 31.5–35.8)
MCV: 90.4 fL (ref 78.0–96.0)
MPV: 9.7 fL (ref 8.9–12.5)
Nucleated RBC: 0 /100 WBC (ref 0.0–0.0)
Platelets: 859 10*3/uL — ABNORMAL HIGH (ref 142–346)
RBC: 4.36 10*6/uL (ref 4.20–5.90)
RDW: 13 % (ref 11–15)
WBC: 12.64 10*3/uL — ABNORMAL HIGH (ref 3.10–9.50)

## 2018-05-10 LAB — GFR
EGFR: >60
EGFR: >60
EGFR: >60

## 2018-05-10 LAB — I-STAT CHEM 8 CARTRIDGE
BUN I-Stat: 34 mg/dL — ABNORMAL HIGH (ref 8–26)
Chloride I-Stat: 102 mEq/L (ref 98–109)
Creatinine I-Stat: 1.1 mg/dL (ref 0.6–1.3)
Glucose I-Stat: 168 mg/dL — ABNORMAL HIGH (ref 70–100)
Hematocrit I-Stat: 29 % — ABNORMAL LOW (ref 42.0–52.0)
Hemoglobin I-Stat: 9.9 g/dL — ABNORMAL LOW (ref 13.0–17.0)
Potassium I-Stat: 3.4 mEq/L — ABNORMAL LOW (ref 3.5–4.9)
Sodium I-Stat: 142 mEq/L (ref 138–146)
i-STAT CO2: 31 mEq/L — ABNORMAL HIGH (ref 24–29)
i-STAT Calcium Ionized: 2.6 mEq/L (ref 2.24–2.64)

## 2018-05-10 LAB — MAGNESIUM: Magnesium: 2.6 mg/dL (ref 1.6–2.6)

## 2018-05-10 LAB — CALCIUM, IONIZED: Calcium, Ionized: 2.56 mEq/L (ref 2.30–2.58)

## 2018-05-10 MED ORDER — SODIUM CHLORIDE 0.9 % IV BOLUS
1000.00 mL | Freq: Once | INTRAVENOUS | Status: AC
Start: 2018-05-10 — End: 2018-05-10
  Administered 2018-05-10: 10:00:00 1000 mL via INTRAVENOUS

## 2018-05-10 MED ORDER — INSULIN REGULAR HUMAN 100 UNIT/ML IJ SOLN
5.00 [IU] | Freq: Once | INTRAMUSCULAR | Status: AC
Start: 2018-05-10 — End: 2018-05-10
  Administered 2018-05-10: 11:00:00 5 [IU] via INTRAVENOUS
  Filled 2018-05-10: qty 3

## 2018-05-10 MED ORDER — CALCIUM GLUCONATE 10 % IV SOLN
1.00 g | Freq: Once | INTRAVENOUS | Status: AC
Start: 2018-05-10 — End: 2018-05-10
  Administered 2018-05-10: 11:00:00 1 g via INTRAVENOUS
  Filled 2018-05-10: qty 10

## 2018-05-10 MED ORDER — IOHEXOL 350 MG/ML IV SOLN
100.00 mL | Freq: Once | INTRAVENOUS | Status: AC | PRN
Start: 2018-05-10 — End: 2018-05-10
  Administered 2018-05-10: 14:00:00 100 mL via INTRAVENOUS

## 2018-05-10 MED ORDER — DEXTROSE 50 % IV SOLN
50.00 mL | Freq: Once | INTRAVENOUS | Status: AC
Start: 2018-05-10 — End: 2018-05-10
  Administered 2018-05-10: 14:00:00 50 mL via INTRAVENOUS

## 2018-05-10 MED ORDER — DEXTROSE 50 % IV SOLN
0.00 g | Freq: Once | INTRAVENOUS | Status: AC
Start: 2018-05-10 — End: 2018-05-10
  Administered 2018-05-10: 11:00:00 25 g via INTRAVENOUS
  Filled 2018-05-10: qty 50

## 2018-05-10 MED ORDER — SODIUM BICARBONATE 8.4 % IV SOLN
50.00 meq | Freq: Once | INTRAVENOUS | Status: AC
Start: 2018-05-10 — End: 2018-05-10
  Administered 2018-05-10: 11:00:00 50 meq via INTRAVENOUS
  Filled 2018-05-10: qty 50

## 2018-05-10 MED ORDER — DEXTROSE-NACL 5-0.45 % IV SOLN
INTRAVENOUS | Status: DC
Start: 2018-05-10 — End: 2018-05-10

## 2018-05-10 MED ORDER — DEXTROSE 50 % IV SOLN
INTRAVENOUS | Status: AC
Start: 2018-05-10 — End: 2018-05-11
  Filled 2018-05-10: qty 50

## 2018-05-10 MED ORDER — DEXTROSE 10 % IV SOLN
INTRAVENOUS | Status: DC
Start: 2018-05-10 — End: 2018-05-11

## 2018-05-10 MED ORDER — SODIUM POLYSTYRENE SULFONATE 15 GM/60ML PO SUSP
60.00 g | Freq: Once | ORAL | Status: DC
Start: 2018-05-10 — End: 2018-05-10

## 2018-05-10 MED ORDER — DEXTROSE-NACL 5-0.2 % IV SOLN
INTRAVENOUS | Status: DC
Start: 2018-05-10 — End: 2018-05-10

## 2018-05-10 MED ORDER — SODIUM CHLORIDE 0.9 % IV BOLUS
50.00 mL | Freq: Once | INTRAVENOUS | Status: DC
Start: 2018-05-10 — End: 2018-05-10

## 2018-05-10 MED ORDER — SODIUM CHLORIDE 0.9 % IV BOLUS
1000.00 mL | Freq: Once | INTRAVENOUS | Status: AC
Start: 2018-05-10 — End: 2018-05-10
  Administered 2018-05-10: 12:00:00 1000 mL via INTRAVENOUS

## 2018-05-10 MED ORDER — DEXTROSE 50 % IV SOLN
50.00 mL | Freq: Once | INTRAVENOUS | Status: AC
Start: 2018-05-10 — End: 2018-05-10
  Administered 2018-05-10: 15:00:00 50 mL via INTRAVENOUS

## 2018-05-10 MED ORDER — GABAPENTIN 50 MG/ML UNIT DOSE
100.00 mg | Freq: Three times a day (TID) | ORAL | Status: DC
Start: 2018-05-10 — End: 2018-05-15
  Administered 2018-05-10 – 2018-05-15 (×12): 100 mg via ORAL
  Filled 2018-05-10 (×14): qty 6

## 2018-05-10 NOTE — Progress Notes (Signed)
Patient requires upgrade to [facility] for hypoglycemia    Patient seen and examined  Interventions ordered: D50 given for unreadable Blood glucose now in 200's, RN at the bedside, will resume tube feeds and start D5 MIVF.  Patient's CT scan of the abdomen unremarkable.   Patient trying to urinate for destended bladder, if unable to, will I&O cath, possible foley for high output.     Plan of care upon upgrade, discussed with Dr. Hewitt Shorts.     Neita Carp, FNP-C  Trauma Acute Care Surgery  Specta # [phone]

## 2018-05-10 NOTE — Plan of Care (Signed)
Problem: Safety  Goal: Patient will be free from injury during hospitalization  Outcome: Progressing  Flowsheets (Taken 05/03/2018 2000 by Lim, Karen J, RN)  Patient will be free from injury during hospitalization : Ensure appropriate safety devices are available at the bedside;Assess patient's risk for falls and implement fall prevention plan of care per policy;Assess for patients risk for elopement and implement Elopement Risk Plan per policy;Provide alternative method of communication if needed (communication boards, writing);Include patient/ family/ care giver in decisions related to safety;Provide and maintain safe environment;Use appropriate transfer methods;Hourly rounding

## 2018-05-10 NOTE — Progress Notes (Signed)
Called by RN that patient became unresponsive to verbal/sternal rub. Had received contrast through G tube for going down to CT scan of abd to r/o leak with tachycardia and electrolyte abnormalities.     Went to bedside    Glucose read as "low". Patient had received 5u insulin and D50 amp prior for hyperkalemia of 6.1 with an episode of rigors around 11am.   2L NS had been given this AM also.     Patient was nonresponsive, pupils equal/sluggish to light.   BP 120s, HR 100s, O2 100% on RA.   Another amp of D50 given with immediate responsiveness.     istat in room showed K+ 3.4, glucose 168.     - started D5 1/2NS at 75ml  - CT abd w/ IV/PO contrast to r/o leak  - f/u 1300 BMP and iCal.   - based on K+ on repeat BMP, will give kayexalate rectally   - continue to monitor on Tele    Dr. Hewitt Shorts at bedside  Jolene Provost, MD  General Surgery PGY-1
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
## 2018-05-11 DIAGNOSIS — G8911 Acute pain due to trauma: Secondary | ICD-10-CM

## 2018-05-11 DIAGNOSIS — S32009A Unspecified fracture of unspecified lumbar vertebra, initial encounter for closed fracture: Secondary | ICD-10-CM

## 2018-05-11 LAB — CBC
Absolute NRBC: 0 10*3/uL (ref 0.00–0.00)
Hematocrit: 30.6 % — ABNORMAL LOW (ref 37.6–49.6)
Hgb: 9.8 g/dL — ABNORMAL LOW (ref 12.5–17.1)
MCH: 28.7 pg (ref 25.1–33.5)
MCHC: 32 g/dL (ref 31.5–35.8)
MCV: 89.7 fL (ref 78.0–96.0)
MPV: 9.8 fL (ref 8.9–12.5)
Nucleated RBC: 0 /100 WBC (ref 0.0–0.0)
Platelets: 690 10*3/uL — ABNORMAL HIGH (ref 142–346)
RBC: 3.41 10*6/uL — ABNORMAL LOW (ref 4.20–5.90)
RDW: 13 % (ref 11–15)
WBC: 11.99 10*3/uL — ABNORMAL HIGH (ref 3.10–9.50)

## 2018-05-11 LAB — BASIC METABOLIC PANEL
BUN: 20 mg/dL (ref 9.0–28.0)
CO2: 27 mEq/L (ref 22–29)
Calcium: 9.2 mg/dL (ref 7.9–10.2)
Chloride: 98 mEq/L — ABNORMAL LOW (ref 100–111)
Creatinine: 0.9 mg/dL (ref 0.7–1.3)
Glucose: 141 mg/dL — ABNORMAL HIGH (ref 70–100)
Potassium: 4.3 mEq/L (ref 3.5–5.1)
Sodium: 135 mEq/L — ABNORMAL LOW (ref 136–145)

## 2018-05-11 LAB — GLUCOSE WHOLE BLOOD - POCT
Whole Blood Glucose POCT: 95 mg/dL (ref 70–100)
Whole Blood Glucose POCT: 131 mg/dL — ABNORMAL HIGH (ref 70–100)
Whole Blood Glucose POCT: 91 mg/dL (ref 70–100)
Whole Blood Glucose POCT: 142 mg/dL — ABNORMAL HIGH (ref 70–100)
Whole Blood Glucose POCT: 137 mg/dL — ABNORMAL HIGH (ref 70–100)
Whole Blood Glucose POCT: 137 mg/dL — ABNORMAL HIGH (ref 70–100)
Whole Blood Glucose POCT: 107 mg/dL — ABNORMAL HIGH (ref 70–100)
Whole Blood Glucose POCT: 107 mg/dL — ABNORMAL HIGH (ref 70–100)
Whole Blood Glucose POCT: 105 mg/dL — ABNORMAL HIGH (ref 70–100)
Whole Blood Glucose POCT: 108 mg/dL — ABNORMAL HIGH (ref 70–100)
Whole Blood Glucose POCT: 96 mg/dL (ref 70–100)
Whole Blood Glucose POCT: 88 mg/dL (ref 70–100)
Whole Blood Glucose POCT: 96 mg/dL (ref 70–100)
Whole Blood Glucose POCT: 87 mg/dL (ref 70–100)
Whole Blood Glucose POCT: 93 mg/dL (ref 70–100)

## 2018-05-11 LAB — GFR: EGFR: >60

## 2018-05-11 LAB — MAGNESIUM: Magnesium: 1.8 mg/dL (ref 1.6–2.6)

## 2018-05-11 MED ORDER — BISACODYL 10 MG RE SUPP
10.00 mg | Freq: Once | RECTAL | Status: DC
Start: 2018-05-11 — End: 2018-05-17
  Filled 2018-05-11: qty 1

## 2018-05-11 MED ORDER — BISACODYL 10 MG RE SUPP
10.00 mg | Freq: Once | RECTAL | Status: DC
Start: 2018-05-11 — End: 2018-05-11

## 2018-05-11 NOTE — Progress Notes (Signed)
Nutrition F/U:    No new recs, continue Pivot @ 65ml/hr    Progress Reviewed:  Episode of hypoglycemia yesterday after giving insulin and dextrose for K+6.1, D10 started, BG stable currently  G-tube to gravity  Superior J-tube to gravity, inferior J-tube with continuous feeds + flushes and bile refeeding q6hrs from G-tube output  Labs: BG 141, 91, 142, 137, 137; BUN 20, cr 0.9, Na+135, K+4.3  Meds: dulcolax, D10 @125ml /hr currently  Nutr Support Order: Pivot @ 57ml/hr with flushes q4hrs (provides 2160 kcals, 135gm protein, total ~2167mls free water)  Est Nutr Needs: 1850-2150 kcals (30-35 kcal/kg), 95-115gm protein (1.5-1.8gm/kg)    M/E:  Clinical progress, labs, GI, weight trend, med/surg tx plan.    Following  Karin Lieu RD [phone]

## 2018-05-11 NOTE — OT Progress Note (Signed)
[facility]   Occupational Therapy Cancellation Note      Patient:  Jorge [name] MRN#:  [id number]  Unit:  [facility] TOWER 3 Room/Bed:  F347/F347.01    05/11/2018  Time: 1640      Patient not seen for occupational therapy secondary to pt refusal due to c/o feeling tired.  Will follow up at later time.    Mary Sella, OTR/L  Pager [phone]
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
## 2018-05-11 NOTE — Progress Notes (Signed)
Charge RN called to bedside due to patient becoming unresponsive to voice and pain.  VSS except for BG that was too low to read.  Gave D50 push and called MD to bedside.  BG became stable and patient was AOX4, patient taken down to CT by RN transport.  Patient became lethargic and unresponsive on the way back from CT.  BG again unreadable.  D50 amp pushed and BG became stable.  MDs to bedside, D51/2NS started and patient upgraded to [facility] with Q1H accuchecks.  Patient's blood glucose began dropping once more down to the 30s.  D50 amp given again and patient started on D10 drip. BG stabilized.
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
## 2018-05-12 LAB — GLUCOSE WHOLE BLOOD - POCT
Whole Blood Glucose POCT: 100 mg/dL (ref 70–100)
Whole Blood Glucose POCT: 103 mg/dL — ABNORMAL HIGH (ref 70–100)
Whole Blood Glucose POCT: 84 mg/dL (ref 70–100)
Whole Blood Glucose POCT: 88 mg/dL (ref 70–100)
Whole Blood Glucose POCT: 94 mg/dL (ref 70–100)
Whole Blood Glucose POCT: 94 mg/dL (ref 70–100)
Whole Blood Glucose POCT: 94 mg/dL (ref 70–100)
Whole Blood Glucose POCT: 95 mg/dL (ref 70–100)

## 2018-05-12 MED ORDER — MAGNESIUM HYDROXIDE 400 MG/5ML PO SUSP
15.00 mL | Freq: Every day | ORAL | Status: DC
Start: 2018-05-12 — End: 2018-05-15
  Administered 2018-05-12 – 2018-05-15 (×2): 15 mL via JEJUNOSTOMY
  Filled 2018-05-12 (×3): qty 30

## 2018-05-12 MED ORDER — DIAZEPAM 1 MG/ML PO SOLN
2.00 mg | Freq: Three times a day (TID) | ORAL | Status: DC | PRN
Start: 2018-05-12 — End: 2018-05-17

## 2018-05-12 MED ORDER — SENNOSIDES 8.8MG/5ML UNIT DOSE SYRINGE
8.80 mg | ORAL_SOLUTION | Freq: Every evening | ORAL | Status: DC
Start: 2018-05-12 — End: 2018-05-14
  Filled 2018-05-12 (×4): qty 5

## 2018-05-12 NOTE — Progress Notes (Signed)
Met with patient and provided him with a drawing of his injury (duodenal repair) and drain placements (gastrostomy, retrograde jejunostomy, and feeding jejunostomy). Educated patient on indication for different drains and our plan to do clamp trials of the G tube to see if bilious drainage will begin moving "downstream" and drain out of the retrograde jejunostomy. Educated patient to alert nurse if he begins to feel "full", bloated, or nauseated. Also provided brief overview of long-term goals: duodenal repair would heal and intestinal lumen would be patent, allowing flow of gastric contents through the small bowel. Educated patient that timeline for allowing him to eat/drink is over the course of weeks to allow repair to heal. Pt asked appropriate questions and verbalized understanding.       Riley Kill, RN, BSN, TCRN  Patient Care Navigator, Trauma Acute Care Surgery  [phone]

## 2018-05-12 NOTE — PT Progress Note (Signed)
[facility]   Physical Therapy Treatment  Patient:  Jorge [name] MRN#:  [id number]  Unit: [facility] TOWER 3  Bed: F347/F347.01    Discharge Recommendations:   D/C Recommendations: Home with supervision, Home with home health PT   DME Recommendations: (TBD)     If Home with supervision, Home with home health PT recommended discharge disposition is not available, patient will need Rehab.        Recommendations can change; please see most recent physical therapy treatment note for updates.     Assessment:   Patient with increased pain and dizziness during mobility today. Performed supine to sit Min A. Mod A to Don TLSO at EOB. CGA for sit/stand and stand pivot transfers. Able to Amb CG-Min A with HHA. Min A for self care and hygiene in bathroom. Rest breaks d/t fatigue. Continue with POC    Treatment Activities:   Transfers, gait, bed mobility, balance    Educated the patient to role of physical therapy, plan of care, goals of therapy and safety with mobility and ADLs, spine precautions, [place] plans and recs.    Plan:   Treatment/Interventions: Exercise, Gait training, Stair training, Neuromuscular re-education, Compensatory technique education, Bed mobility, Equipment eval/education, Patient/family training, Endurance training, LE strengthening/ROM, Functional transfer training        PT Frequency: 3-4x/wk     Continue plan of care.       Precautions and Contraindications:   Fall Risk  TLSO when OOB, don at EOB  Spine precautions  Multi drains  PEG    Admitting Medical Diagnosis:   Laceration of liver, initial encounter [S36.113A]  Liver laceration [S36.113A]      Updated Medical Status/Imaging/Labs:   No results found.  Lab Results   Component Value Date/Time    HGB 9.8 (L) 05/11/2018 04:25 AM    HCT 30.6 (L) 05/11/2018 04:25 AM    K 4.3 05/11/2018 04:25 AM    NA 135 (L) 05/11/2018 04:25 AM    INR 1.6 (H) 05/01/2018 01:07 AM         Subjective:   "I have to go to the bathroom real  bad"    Pain Assessment:  Pain Scale: wong baker faces  Pain Score: 6-8  Pain Location: abdomen  Pain Intervention: rn aware      Patient's medical condition is appropriate for Physical Therapy intervention at this time.  Patient is agreeable to participation in the therapy session. Nursing clears patient for therapy. Officer present throughout session    Objective:   Patient is in bed with telemetry, peripheral IV, JP Drain and PEG tube multi abd drains in place.    Vitals:  stable    Cognition/Neuro Status:  Orientation Level: alert and oriented  Behavior: cooperatived, anxious  Safety awareness: educated, decreased      Functional Mobility/Transfers:  Rolling: CGA  Supine to Sit: Min A, bedrail  Sit to Stand: CGA  Stand to Sit: CGA  Bed to Chair: CGA-Min A  Stand Pivot: CGA      Ambulation:  PMP Activity: Step 6 - Walks in Room  Distance: 30 feet x1, 25 feet x 2  Assist: CG-Min A  Assistive Device: HHA  Pattern: unsteady, decreased cadence and step length  Number of Stairs: nt      Therapeutic Exercise:  Reviewed HEP      Patient Participation: Good  Patient Endurance: Fair+      Patient left with call bell within reach, all needs  met, and all questions answered. RN notified of session outcome and patient response.     SCD: na  Fall mat: in place  Bed alarm: na  Chair alarm: on   Avasys: na      Goals:  Goals  Goal Formulation: With patient  Time for Goal Acheivement: 5 visits  Pt Will Go Supine To Sit: modified independent, with supervision  Pt Will Perform Sit To Supine: modified independent, with supervision  Pt Will Perform Sit to Stand: modified independent, with supervision  Pt Will Transfer Bed/Chair: modified independent, with supervision  Pt Will Ambulate: > 200 feet, with supervision, with stand by assist  Pt Will Go Up / Down Stairs: 1 flight, With rail, with stand by assist  Other Goal: Pt will safely don/doff TLSO at EOB with sba      Bennet Kujawa Cannady-Small, PT, DPT 3:05 PM 05/12/2018  Pager  [phone]      Time of Treatment  PT Received On: 05/12/18  Start Time: 1315  Stop Time: 1400  Time Calculation (min): 45 min    Treatment # 3 out of 5 visits
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
## 2018-05-12 NOTE — Progress Notes (Signed)
Attending physician notified patient of medication error. Nursing supervisor at bedside as well. Pt stated understanding and all questions were answered. Risk is aware and involved in situation.

## 2018-05-12 NOTE — OT Progress Note (Signed)
Occupational Therapy Note    [facility]   Occupational Therapy Treatment     Patient: Jorge [name]    MRN#: [id number]   Unit: [facility] TOWER 3  Bed: F347/F347.01      Discharge Recommendations:   Discharge Recommendation: Home with supervision, Home with home health OT   DME Recommended for Discharge: (RW, shower chair, reacher)    If Home with supervision, Home with home health OT recommended discharge disposition is not available, patient will need rehab.    Assessment:   Pt with decreased activity tolerance with c/o dizziness and O2 desaturation during session today.  Pt performing sit to stand transfers at SBA level.  CGA for ambulation household distances using IV pole for support prn and with cues for pacing. Seated rest break to return to room due to dizziness/O2 desaturation and pt assisted back to bed.  RN at bedside.  SBA and minimal cues to doff TLSO EOB.  Cont OT    Treatment Activities: there ex    Educated the patient to role of occupational therapy, plan of care, goals of therapy and safety with mobility and ADLs and spine precautions.    Plan:    OT Frequency Recommended: 3-4x/wk     Continue plan of care.       Precautions and Contraindications:   Fall Risk  TLSO when OOB, don at EOB  Spine precautions  Multi drains  PEG    Updated Medical Status/Imaging/Labs:  Chart reviewed  Lab Results   Component Value Date/Time    HGB 9.8 (L) 05/11/2018 04:25 AM    HCT 30.6 (L) 05/11/2018 04:25 AM    K 4.3 05/11/2018 04:25 AM    NA 135 (L) 05/11/2018 04:25 AM    INR 1.6 (H) 05/01/2018 01:07 AM         Subjective:   Patient's medical condition is appropriate for Occupational Therapy intervention at this time.  Patient is agreeable to participation in the therapy session. Nursing clears patient for therapy.    Pain:   Scale: did not rate  Location: drain site discomfort  Intervention: RN aware    Objective:   Patient is seated in a bedside chair with timc monitors, aspen  collar, TLSO brace, PIV access, drains, PEG tube in place.    Functional Mobility  Sit to supine: SBA and cues  Sit to Stand: SBA  Transfers: CGA using IV pole for support prn    PMP Activity: Step 7 - Walks out of Room    Balance  Static Sitting: good  Static Standing: good    Self Care and Home Management  UE Dressing: SBA and min cues to doff TLSO EOB      Participation: good  Endurance: fair+; some dizziness reported while ambulating requiring seated rest break x 1. VSS end of session. SpO2  Decreased to 73% during ambulation; 100% on RA once back in room on monitor  Patient left with call bell within reach, all needs met, SCDs off, bed alarm off, chair alarm na and all questions answered. RN notified of session outcome and patient response.     Goals:  Time For Goal Achievement: 5 visits  ADL Goals  Patient will dress upper body: Supervision(don/doff TLSO EOB)  Patient will dress lower body: Modified Independent  Pt will complete bathing: Supervision  Patient will toilet: Supervision  Mobility and Transfer Goals  Pt will perform functional transfers: Supervision      Time of Treatment  OT Received On: 05/12/18  Start Time: 1640  Stop Time: 1705  Time Calculation (min): 25 min    Treatment #4 of 5 visits  Mary Sella, OTR/L  Pager [phone]

## 2018-05-12 NOTE — Plan of Care (Signed)
Problem: Safety  Goal: Patient will be free from injury during hospitalization  Outcome: Progressing    Patient worked with OT, up with 1-2 assist, stand pivot to chair/bed/stretcher.      Problem: Pain  Goal: Pain at adequate level as identified by patient  Outcome: Progressing     Problem: Moderate/High Fall Risk Score >5  Goal: Patient will remain free of falls  Outcome: Progressing   Patient alert, oriented, and able to use call light appropriately.
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
## 2018-05-13 LAB — GLUCOSE WHOLE BLOOD - POCT: Whole Blood Glucose POCT: 125 mg/dL — ABNORMAL HIGH (ref 70–100)
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
## 2018-05-13 NOTE — Plan of Care (Signed)
TACS Nursing Progress Note    Jorge [name] is a 22 y.o. male  Admitted 04/30/2018  3:56 AM [facility] day 13) for Laceration of liver, initial encounter [S36.113A]  Liver laceration [S36.113A]        Major Shift Events:  No acute events during the shift.     #1J tube clamped today. Diet advanced to clear liquids. Pt c/o 8/10 pain, Prn Oxycodone given Q4. Pt ambulated the unit X2 with minimal assist.     Review of Systems  Neuro:  A&OX4    Cardiac:  S1S2 auscultated, Blood pressure soft, HR in 80-90s.     Respiratory:  Room air with saturation >95%, lungs diminished    GI/GU:  Diet advanced to clear liquid. Voiding to urinal/toilet. Active bowel sounds.     BM this shift? Yes    Skin Assessment  Skin Integrity: Abrasion, Laceration  Abrasion Skin Location: R knee, RUE     Braden Scale Score: 19 (05/13/18 1400)    Patient admitted/transferred to this unit this shift? No  If yes, 4 eyes in 4 hours check complete? N/A  Second RN name:    LDAWs  Patient Lines/Drains/Airways Status      Active Lines, Drains and Airways       Name:   Placement date:   Placement time:   Site:   Days:    Peripheral IV 05/06/18 Left Forearm   05/06/18    2000    Forearm   6    Closed/Suction Drain Right;Anterior RLQ Bulb 19 Fr.   04/30/18    1604    RLQ   13    Closed/Suction Drain Right;Anterior RLQ Bulb 19 Fr.   04/30/18    1605    RLQ   13    Gastrostomy/Enterostomy Gastrostomy 18 Fr. LUQ   04/30/18    1548    LUQ   13    Gastrostomy/Enterostomy Jejunostomy 14 Fr. LUQ   04/30/18    1554    LUQ   13    Gastrostomy/Enterostomy Jejunostomy 14 Fr. LLQ   04/30/18    1601    LLQ   13                   Wound 04/30/18 Traumatic Not applicable Arm Right;Upper dog bite (Active)   Date First Assessed/Time First Assessed: 04/30/18 0800   Wound Type: Traumatic  Pressure Injury Staging (WOCN/ Trained RNs Only): Not applicable  Location: Arm  Wound Location Orientation: Right;Upper  Wound Description (Comments): dog bite  Present o...      Assessments  04/30/2018  8:00 AM 05/13/2018  2:00 PM   Site Description Red Pink   Peri-wound Description Red Clean;Dry;Pink   Closure -- Open to air   Drainage Amount Moderate None   Drainage Description Serous --   Dressing Abdominal Dressing;Xeroform;Gauze Bandage Roll Open to air   Dressing Changed Changed --   Dressing Status Clean;Dry;Intact --       No Linked orders to display       Wound 04/30/18 Traumatic Knee Right dog bite (Active)   Date First Assessed/Time First Assessed: 04/30/18 0800   Wound Type: Traumatic  Location: Knee  Wound Location Orientation: Right  Wound Description (Comments): dog bite  Present on Admission: Yes      Assessments 04/30/2018  8:00 AM 05/13/2018  2:00 PM   Site Description Red Pink   Peri-wound Description Red Pink;Clean;Dry   Closure -- Open to air   Drainage  Amount Small None   Drainage Description Serous --   Dressing Gauze Open to air   Dressing Changed Reinforced --   Dressing Status Old drainage;Intact --       No Linked orders to display       Wound 04/30/18 Traumatic Finger (Comment which one) Left dog bite (Active)   Date First Assessed/Time First Assessed: 04/30/18 0800   Wound Type: Traumatic  Location: (c) Finger (Comment which one)  Wound Location Orientation: Left  Wound Description (Comments): dog bite  Present on Admission: Yes      Assessments 04/30/2018  8:00 AM 05/13/2018  2:00 PM   Site Description Red Pink   Peri-wound Description Red Pink;Clean;Dry;Intact   Drainage Amount Scant None   Drainage Description Serous --   Dressing Xeroform Open to air   Dressing Changed Changed --   Dressing Status Clean;Dry;Intact --       No Linked orders to display       Wound 04/30/18 Surgical Incision Knee Right (Active)   Date First Assessed/Time First Assessed: 04/30/18 1437   Wound Type: Surgical Incision  Location: Knee  Wound Location Orientation: Right      Assessments 04/30/2018  5:10 PM 05/13/2018  2:00 PM   Site Description Other (Comment) Pink   Peri-wound Description Unable to  assess Clean;Dry;Pink   Drainage Amount None None   Dressing Gauze;Transparent Film Open to air       No Linked orders to display       Wound 04/30/18 Surgical Incision Abdomen Other (Comment) 3  inch tape over 4x4  (Active)   Date First Assessed/Time First Assessed: 04/30/18 1437   Wound Type: Surgical Incision  Location: Abdomen  Wound Location Orientation: Other (Comment)  Wound Description (Comments): 3  inch tape over 4x4       Assessments 04/30/2018  5:10 PM 05/13/2018  2:00 PM   Site Description -- Clean;Dry;Intact   Peri-wound Description Unable to assess Clean;Dry;Intact   Closure -- Staples   Dressing Abdominal Dressing;Gauze;Transparent Film --       No Linked orders to display           Indication for Central Access and estimated target removal date?  N/A      Indication for Foley and estimated target removal date?  N/A      Psycho/Social:  Pt is calm, cooperative and expresses needs accordingly.     Interpreter Services:  Does the patient require an Interpreter? No    If so, what type of interpreter was used? N/A  If the family was utilized, is the interpreter waiver signed and located in the chart? N/A      Disposition for Discharge: [facility]

## 2018-05-13 NOTE — Plan of Care (Addendum)
TACS Nursing Progress Note    Jorge [name] is a 22 y.o. male  Admitted 04/30/2018  3:56 AM [facility] day 13) for Laceration of liver, initial encounter [S36.113A]  Liver laceration [S36.113A]        Major Shift Events:  No significant events overnight     Review of Systems  Neuro:  A/Ox4, Q4 neurchecks, no change overnight. MAE. TLSO OOB, full sensation on all extremities    Cardiac:  HR 80s-90s, afebrile, SBPs 90s-110s    Respiratory:  RA, lungs clear/dim with productive cough that pt suctions himself. Oral care Q4    GI/GU:  NPO, G tube still clamped. TF Pivot 1.5 12ml/hr, no residuals. Output from IR drain noted. Q8 flushes except for clamped Gtube    BM this shift? No     Skin Assessment  Skin Integrity: Abrasion, Laceration  Abrasion Skin Location: R knee, RUE     Braden Scale Score: 19 (05/12/18 2000)    Patient admitted/transferred to this unit this shift? No   If yes, 4 eyes in 4 hours check complete? No   Second RN name:    LDAWs  Patient Lines/Drains/Airways Status    Active Lines, Drains and Airways     Name:   Placement date:   Placement time:   Site:   Days:    Peripheral IV 05/06/18 Left Forearm   05/06/18    2000    Forearm   6    Closed/Suction Drain Right;Anterior RLQ Bulb 19 Fr.   04/30/18    1604    RLQ   12    Closed/Suction Drain Right;Anterior RLQ Bulb 19 Fr.   04/30/18    1605    RLQ   12    Gastrostomy/Enterostomy Gastrostomy 18 Fr. LUQ   04/30/18    1548    LUQ   12    Gastrostomy/Enterostomy Jejunostomy 14 Fr. LUQ   04/30/18    1554    LUQ   12    Gastrostomy/Enterostomy Jejunostomy 14 Fr. LLQ   04/30/18    1601    LLQ   12               Wound 04/30/18 Traumatic Not applicable Arm Right;Upper dog bite (Active)   Date First Assessed/Time First Assessed: 04/30/18 0800   Wound Type: Traumatic  Pressure Injury Staging (WOCN/ Trained RNs Only): Not applicable  Location: Arm  Wound Location Orientation: Right;Upper  Wound Description (Comments): dog bite  Present o...      Assessments  04/30/2018  8:00 AM 05/13/2018  4:00 AM   Site Description Red Pink   Peri-wound Description Red Pink;Clean;Dry;Intact   Closure - Open to air   Drainage Amount Moderate None   Drainage Description Serous -   Dressing Abdominal Dressing;Xeroform;Gauze Bandage Roll Open to air   Dressing Changed Changed -   Dressing Status Clean;Dry;Intact Clean;Dry;Intact       No Linked orders to display       Wound 04/30/18 Traumatic Knee Right dog bite (Active)   Date First Assessed/Time First Assessed: 04/30/18 0800   Wound Type: Traumatic  Location: Knee  Wound Location Orientation: Right  Wound Description (Comments): dog bite  Present on Admission: Yes      Assessments 04/30/2018  8:00 AM 05/13/2018  4:00 AM   Site Description Red Pink   Peri-wound Description Red Pink;Clean;Dry;Intact   Closure - Open to air   Drainage Amount Small None   Drainage Description Serous -  Dressing Gauze Open to air   Dressing Changed Reinforced -   Dressing Status Old drainage;Intact Clean;Dry;Intact       No Linked orders to display       Wound 04/30/18 Traumatic Finger (Comment which one) Left dog bite (Active)   Date First Assessed/Time First Assessed: 04/30/18 0800   Wound Type: Traumatic  Location: (c) Finger (Comment which one)  Wound Location Orientation: Left  Wound Description (Comments): dog bite  Present on Admission: Yes      Assessments 04/30/2018  8:00 AM 05/13/2018  4:00 AM   Site Description Red Pink   Peri-wound Description Red Pink;Clean;Dry;Intact   Drainage Amount Scant None   Drainage Description Serous -   Dressing Xeroform Open to air   Dressing Changed Changed -   Dressing Status Clean;Dry;Intact Clean;Dry;Intact       No Linked orders to display       Wound 04/30/18 Surgical Incision Knee Right (Active)   Date First Assessed/Time First Assessed: 04/30/18 1437   Wound Type: Surgical Incision  Location: Knee  Wound Location Orientation: Right      Assessments 04/30/2018  5:10 PM 05/13/2018  4:00 AM   Site Description Other  (Comment) Pink   Peri-wound Description Unable to assess Pink;Clean;Dry;Intact   Drainage Amount None None   Dressing Gauze;Transparent Film Open to air   Dressing Status - Clean;Dry;Intact       No Linked orders to display       Wound 04/30/18 Surgical Incision Abdomen Other (Comment) 3  inch tape over 4x4  (Active)   Date First Assessed/Time First Assessed: 04/30/18 1437   Wound Type: Surgical Incision  Location: Abdomen  Wound Location Orientation: Other (Comment)  Wound Description (Comments): 3  inch tape over 4x4       Assessments 04/30/2018  5:10 PM 05/13/2018  4:00 AM   Site Description - Clean;Dry   Peri-wound Description Unable to assess Clean;Dry   Closure - Staples   Drainage Amount - None   Dressing Abdominal Dressing;Gauze;Transparent Film Open to air;Transparent Film   Dressing Status - Clean;Dry;Intact       No Linked orders to display           Psycho/Social:  WDL    Interpreter Services:  Does the patient require an Interpreter? No           Disposition for Discharge: TBD      Problem: Nutrition  Goal: Nutritional intake is adequate  05/13/2018 0630 by Antionette Char, RN  Outcome: Progressing  Flowsheets (Taken 05/13/2018 0630)  Nutritional intake is adequate: Monitor daily weights; Assist patient with meals/food selection; Allow adequate time for meals; Encourage/perform oral hygiene as appropriate; Consult/collaborate with Clinical Nutritionist; Include patient/patient care companion in decisions related to nutrition  05/13/2018 0628 by Antionette Char, RN  Outcome: Progressing
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
## 2018-05-14 DIAGNOSIS — S36400A Unspecified injury of duodenum, initial encounter: Secondary | ICD-10-CM

## 2018-05-14 MED ORDER — OXYCODONE HCL 5 MG/5ML PO SOLN
10.00 mg | ORAL | Status: DC | PRN
Start: 2018-05-14 — End: 2018-05-17
  Administered 2018-05-15 – 2018-05-17 (×7): 10 mg via ORAL
  Filled 2018-05-14 (×7): qty 10

## 2018-05-14 MED ORDER — OXYCODONE HCL 5 MG/5ML PO SOLN
5.00 mg | ORAL | Status: DC | PRN
Start: 2018-05-14 — End: 2018-05-17
  Administered 2018-05-14 – 2018-05-15 (×5): 5 mg via ORAL
  Filled 2018-05-14 (×6): qty 5

## 2018-05-14 MED ORDER — SENNOSIDES 8.8MG/5ML UNIT DOSE SYRINGE
8.80 mg | ORAL_SOLUTION | Freq: Every evening | ORAL | Status: DC
Start: 2018-05-14 — End: 2018-05-19
  Administered 2018-05-16: 23:00:00 8.8 mg via ORAL
  Filled 2018-05-14 (×6): qty 5
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
## 2018-05-15 MED ORDER — GABAPENTIN 50 MG/ML UNIT DOSE
300.00 mg | Freq: Three times a day (TID) | ORAL | Status: DC
Start: 2018-05-16 — End: 2018-05-17
  Administered 2018-05-16 – 2018-05-17 (×4): 300 mg via ORAL
  Filled 2018-05-15 (×4): qty 6

## 2018-05-15 MED ORDER — POLYETHYLENE GLYCOL 3350 17 G PO PACK
17.00 g | PACK | Freq: Every day | ORAL | Status: DC
Start: 2018-05-15 — End: 2018-05-18
  Filled 2018-05-15 (×2): qty 1
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
## 2018-05-15 NOTE — Consults (Signed)
Nutrition f/u:    Calorie count started, pt tolerating full liquids well, Ensures added and pt encouraged goal of 3 per day. Continue nocturnal feeds at this time    Daily Goals: 2150-2450 kcals (35-40 kcal/kg), 95-115gm protein    Karin Lieu RD [phone]
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
## 2018-05-15 NOTE — Plan of Care (Addendum)
Pt encouraged to use PO medications, refused. Upper JP drain removed, pain managed with scheduled and PRN medications. All sites C/D/I, no drainage noted. No output from remaining JP drain.    Problem: Safety  Goal: Patient will be free from injury during hospitalization  Outcome: Progressing  Goal: Patient will be free from infection during hospitalization  Outcome: Progressing   All safety precautions in place  Problem: Pain  Goal: Pain at adequate level as identified by patient  Outcome: Progressing   managed with PRN pain meds  Problem: Side Effects from Pain Analgesia  Goal: Patient will experience minimal side effects of analgesic therapy  Outcome: Progressing   tolerated well  Problem: Altered GI Function  Goal: Nutritional intake is adequate  Outcome: Progressing  Stopped tf will start nocturnal tf tonight at 8pm. Poor PO intake, pt does not like ensure that's ordered three times daily.  Goal: Mobility/Activity is maintained at optimal level for patient  Outcome: Progressing  OOB with TLSO brace       Problem: Compromised skin integrity  Goal: Skin integrity is maintained or improved  Outcome: Progressing  C/D/I     Problem: Nutrition  Goal: Nutritional intake is adequate  Outcome: Progressing  Poor PO intake

## 2018-05-15 NOTE — PT Progress Note (Signed)
[facility]   Physical Therapy Treatment  Patient:  Jorge [name] MRN#:  [id number]  Unit: [facility] TOWER 3  Bed: F347/F347.01    Discharge Recommendations:   D/C Recommendations: Home with supervision   DME Recommendations: (none)        Recommendations can change; please see most recent physical therapy treatment note for updates.     Assessment:   Patient making great progress. Mild c/o nausea today. Performed supine to sit with Sup, sit to stand Sup while donning pants. Donned TLSO at EOB with Sup and minimal cues. Ambulated > 500 ft on unit with Sup with good safety and balance. Negotiated 10 steps using single HR with SBA, mild fatigue. No further acute PT needs at this time. Dora PT orders. Thanks    Treatment Activities:   Bed mobility, transfers, gait, stairs, brace training    Educated the patient to role of physical therapy, plan of care, goals of therapy and HEP, safety with mobility and ADLs, spine precautions,  plans and recs.    Plan:   Treatment/Interventions: Exercise, Gait training, Stair training, Neuromuscular re-education, Compensatory technique education, Bed mobility, Equipment eval/education, Patient/family training, Endurance training, LE strengthening/ROM, Functional transfer training        PT Frequency: therapy discontinued     Continue plan of care.       Precautions and Contraindications:   Fall Risk  TLSO when OOB, don at EOB  Spine precautions  Multi drains  PEG      Admitting Medical Diagnosis:   Laceration of liver, initial encounter [S36.113A]  Liver laceration [S36.113A]      Updated Medical Status/Imaging/Labs:   No results found.  Lab Results   Component Value Date/Time    HGB 9.8 (L) 05/11/2018 04:25 AM    HCT 30.6 (L) 05/11/2018 04:25 AM    K 4.3 05/11/2018 04:25 AM    NA 135 (L) 05/11/2018 04:25 AM    INR 1.6 (H) 05/01/2018 01:07 AM         Subjective:   "I feel a little nauseous"    Pain Assessment:  Pain Scale: denies pain      Patient's medical  condition is appropriate for Physical Therapy intervention at this time.  Patient is agreeable to participation in the therapy session. Nursing clears patient for therapy. Officer/guard present throughout session    Objective:   Patient is in bed with SCD's, peripheral IV and PEG tube, JP drain x 2, IMC monitors in place.    Vitals:  stable    Cognition/Neuro Status:  Orientation Level: alert and oriented  Behavior: cooperative  Safety awareness: educated      Functional Mobility/Transfers:  Rolling: Sup  Supine to Sit: Sup  Sit to Stand: Sup  Stand to Sit: Sup  Bed to Chair: Sup  Stand Pivot: Sup      Ambulation:  PMP Activity: Step 7 - Walks out of Room  Distance: >500 feet  Assist: Sup  Assistive Device: none  Pattern: WFL  Number of Stairs: 10 steps, 1 HR, alternate to ascend, step to descend, SBA      Therapeutic Exercise:  During activity      Patient Participation: Good  Patient Endurance: Good      Patient left with call bell within reach, all needs met, and all questions answered. RN notified of session outcome and patient response.     SCD: left off for skin rest  Fall mat: in place  Bed alarm:  na  Chair alarm: on   Avasys: na      Goals:  Goals  Goal Formulation: With patient  Time for Goal Acheivement: 5 visits  Pt Will Go Supine To Sit: modified independent, with supervision  Pt Will Perform Sit To Supine: modified independent, with supervision  Pt Will Perform Sit to Stand: modified independent, with supervision  Pt Will Transfer Bed/Chair: modified independent, with supervision  Pt Will Ambulate: > 200 feet, with supervision, with stand by assist  Pt Will Go Up / Down Stairs: 1 flight, With rail, with stand by assist  Other Goal: Pt will safely don/doff TLSO at EOB with sba      Ariyannah Pauling Cannady-Small, PT, DPT 11:20 AM 05/15/2018  Pager [phone]      Time of Treatment  PT Received On: 05/15/18  Start Time: 1035  Stop Time: 1110  Time Calculation (min): 35 min    Treatment # 4 out of 5 visits
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
## 2018-05-16 NOTE — Consults (Signed)
Calorie count in progress per note 10/11    Per Epic patient consuming 50% of meals  Diet advanced to post gastrectomy with Ensure compact and nocturnal TF    Calorie count to be summarized on 10/14  Jorge [name]  # [phone]
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
## 2018-05-16 NOTE — OT Progress Note (Signed)
[facility]   Occupational Therapy Cancellation Note      Patient:  Jorge [name] MRN#:  [id number]  Unit:  [facility] TOWER 3 Room/Bed:  F347/F347.01    05/16/2018  Time: 0825      Patient not seen for occupational therapy secondary to patient declined OT due to pain, requesting pain medicine before session.  OT in communication with RN.       Thank you,     Ernst Bowler, MS, OTR/L   Pager [phone]

## 2018-05-16 NOTE — Progress Notes (Signed)
Removed 2nd blake drain without issue.  Removed retrograde J tube without issue. Dry gauze and tape placed over both sites.     Jolene Provost, MD  General Surgery PGY-1
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
## 2018-05-17 LAB — GLUCOSE WHOLE BLOOD - POCT: Whole Blood Glucose POCT: 107 mg/dL — ABNORMAL HIGH (ref 70–100)

## 2018-05-17 MED ORDER — DIAZEPAM 2 MG PO TABS
2.00 mg | ORAL_TABLET | Freq: Three times a day (TID) | ORAL | Status: DC | PRN
Start: 2018-05-17 — End: 2018-05-19
  Administered 2018-05-17: 09:00:00 2 mg via ORAL
  Filled 2018-05-17: qty 1

## 2018-05-17 MED ORDER — OXYCODONE HCL 5 MG PO TABS
5.00 mg | ORAL_TABLET | Freq: Four times a day (QID) | ORAL | Status: DC | PRN
Start: 2018-05-17 — End: 2018-05-19
  Administered 2018-05-17 – 2018-05-19 (×9): 5 mg via ORAL
  Filled 2018-05-17: qty 1
  Filled 2018-05-17: qty 3
  Filled 2018-05-17 (×8): qty 1

## 2018-05-17 MED ORDER — GABAPENTIN 300 MG PO CAPS
300.00 mg | ORAL_CAPSULE | Freq: Three times a day (TID) | ORAL | Status: DC
Start: 2018-05-17 — End: 2018-05-19
  Administered 2018-05-17 – 2018-05-19 (×5): 300 mg via ORAL
  Filled 2018-05-17 (×8): qty 1

## 2018-05-17 MED ORDER — ACETAMINOPHEN 325 MG PO TABS
650.0000 mg | ORAL_TABLET | ORAL | Status: DC
Start: 2018-05-17 — End: 2018-05-19
  Filled 2018-05-17 (×5): qty 2

## 2018-05-17 MED ORDER — OXYCODONE HCL 5 MG PO TABS
10.00 mg | ORAL_TABLET | Freq: Four times a day (QID) | ORAL | Status: DC | PRN
Start: 2018-05-17 — End: 2018-05-18

## 2018-05-17 MED ORDER — IBUPROFEN 400 MG PO TABS
400.00 mg | ORAL_TABLET | Freq: Four times a day (QID) | ORAL | Status: DC | PRN
Start: 2018-05-17 — End: 2018-05-19
  Administered 2018-05-19: 15:00:00 400 mg via ORAL
  Filled 2018-05-17 (×2): qty 1
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
## 2018-05-17 NOTE — Plan of Care (Signed)
Problem: Pain  Goal: Pain at adequate level as identified by patient  Outcome: Progressing  Flowsheets (Taken 05/17/2018 0050)  Pain at adequate level as identified by patient: Identify patient comfort function goal; Assess for risk of opioid induced respiratory depression, including snoring/sleep apnea. Alert healthcare team of risk factors identified.; Assess pain on admission, during daily assessment and/or before any "as needed" intervention(s); Reassess pain within 30-60 minutes of any procedure/intervention, per Pain Assessment, Intervention, Reassessment (AIR) Cycle; Evaluate if patient comfort function goal is met; Evaluate patient's satisfaction with pain management progress; Offer non-pharmacological pain management interventions; Consult/collaborate with Pain Service; Consult/collaborate with Physical Therapy, Occupational Therapy, and/or Speech Therapy; Include patient/patient care companion in decisions related to pain management as needed     Problem: Compromised Tissue integrity  Goal: Damaged tissue is healing and protected  Outcome: Progressing  Flowsheets (Taken 05/17/2018 0050)  Damaged tissue is healing and protected : Monitor/assess Braden scale every shift; Provide wound care per wound care algorithm; Reposition patient every 2 hours and as needed unless able to reposition self; Increase activity as tolerated/progressive mobility; Relieve pressure to bony prominences for patients at moderate and high risk; Keep intact skin clean and dry; Avoid shearing injuries; Use bath wipes, not soap and water, for daily bathing; Use incontinence wipes for cleaning urine, stool and caustic drainage. Foley care as needed; Monitor external devices/tubes for correct placement to prevent pressure, friction and shearing; Encourage use of lotion/moisturizer on skin; Monitor patient's hygiene practices; Consult/collaborate with wound care nurse; Utilize specialty bed; Consider placing an indwelling catheter if  incontinence interferes with healing of stage 3 or 4 pressure injury     Problem: Altered GI Function  Goal: Fluid and electrolyte balance are achieved/maintained  Outcome: Progressing  Flowsheets (Taken 05/17/2018 0050)  Fluid and electrolyte balance are achieved/maintained: Monitor/assess lab values and report abnormal values; Assess for confusion/personality changes; Provide adequate hydration; Monitor intake and output every shift; Monitor daily weight; Assess and reassess fluid and electrolyte status; Observe for seizure activity and initiate seizure precautions if indicated; Observe for cardiac arrhythmias; Monitor for muscle weakness
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
## 2018-05-18 MED ORDER — POLYETHYLENE GLYCOL 3350 17 G PO PACK
17.00 g | PACK | Freq: Every day | ORAL | Status: DC | PRN
Start: 2018-05-18 — End: 2018-05-19

## 2018-05-18 NOTE — OT Progress Note (Signed)
Occupational Therapy Note    [facility]   Occupational Therapy Treatment     Patient: Jorge [name]    MRN#: [id number]   Unit: [facility] TOWER 3  Bed: F347/F347.01      Discharge Recommendations:   Discharge Recommendation: Home with supervision   DME Recommended for Discharge: (shower chair, reacher)    Assessment:   Pt has made excellent progress and met all OT goals at this time.  Pt demo ability to perform BADL at Supervision/mod I level including don/doff TLSO brace.  Pt performing functional transfers at S/Mod I level without AD.  Pt has been ambulating household distances with Supervision.  No further acute care OT needs identified.  Recommend home with Supervision at d/c.    Treatment Activities: self care, functional transfers    Educated the patient to role of occupational therapy, plan of care, goals of therapy and safety with mobility and ADLs, spine precautions.    Plan:    OT Frequency Recommended: therapy discontinued     Discharge from OT Acute Care Services.       Precautions and Contraindications:   Fall  TLSO when OOB, may don EOB  Spine precautions  Aspen collar AAT    Updated Medical Status/Imaging/Labs:  Chart reviewed    Subjective:"Can we change these?" *(VW:[id number] collar pads)   Patient's medical condition is appropriate for Occupational Therapy intervention at this time.  Patient is agreeable to participation in the therapy session. Nursing clears patient for therapy.    Pain:   Denies pain    Objective:   Patient is seated in a bedside chair with drains, Aspen collar, TLSO and PIV access in place.    Cognition  intact    Functional Mobility  Supine to Sit: supervision  Sit to Stand: mod I  Transfers: supervision    PMP Activity: Step 7 - Walks out of Room    Balance  Static Sitting: good  Dynamic Sitting: good  Static Standing: good  Dynamic Standing: good    Self Care and Home Management  Eating: independent  Grooming: modified independent   Bathing: set  up/S for seated level sponge bathing  UE Dressing: supervision to don/doff TLSO seated level; set up for hospital gown  LE Dressing: mod I    Participation: good  Endurance: good    Patient left with call bell within reach, all needs met, SCDs off, bed alarm off, chair alarm on and all questions answered. RN notified of session outcome and patient response.     Goals:  Time For Goal Achievement: 5 visits  ADL Goals  Patient will dress upper body: Supervision(don/doff TLSO EOB)-Goal met  Patient will dress lower body: Modified Independent-Goal met  Pt will complete bathing: Supervision-Goal met  Patient will toilet: Supervision-Goal met  Mobility and Transfer Goals  Pt will perform functional transfers: Supervision-Goal met    Time of Treatment  OT Received On: 05/18/18  Start Time: 1235  Stop Time: 1320  Time Calculation (min): 45 min    Treatment # 1 of 5 visits  Mary Sella, OTR/L  Pager [phone]
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
## 2018-05-19 MED ORDER — IBUPROFEN 400 MG PO TABS
400.00 mg | ORAL_TABLET | Freq: Four times a day (QID) | ORAL | Status: AC | PRN
Start: 2018-05-19 — End: 2018-05-26

## 2018-05-19 MED ORDER — OXYCODONE HCL 5 MG PO TABS
5.00 mg | ORAL_TABLET | Freq: Four times a day (QID) | ORAL | 0 refills | Status: DC | PRN
Start: 2018-05-19 — End: 2018-05-19

## 2018-05-19 MED ORDER — NALOXONE HCL 4 MG/0.1ML NA LIQD
NASAL | 0 refills | Status: DC
Start: 2018-05-19 — End: 2018-05-19

## 2018-05-19 MED ORDER — ACETAMINOPHEN 325 MG PO TABS
650.00 mg | ORAL_TABLET | ORAL | Status: DC
Start: 2018-05-19 — End: 2020-02-10

## 2018-05-19 MED ORDER — TRAMADOL HCL 50 MG PO TABS
50.00 mg | ORAL_TABLET | ORAL | 0 refills | Status: DC | PRN
Start: 2018-05-19 — End: 2018-05-25

## 2018-05-19 NOTE — Discharge Summary -  Nursing (Signed)
Discharge Note:     Discharge location:[facility]  Report given to: Marjori    Discharge instructions, follow up (without dates to pt), and medications reviewed and explained to patient and receiving nurse- questions answered stated understanding.      IV and tele removed. All belongings with patient. VSS.   Pt transported off of unit via wheelchair with sheriff
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
## 2018-05-19 NOTE — Plan of Care (Signed)
Problem: Pain  Goal: Pain at adequate level as identified by patient  Outcome: Progressing  Flowsheets (Taken 05/17/2018 0050 by Marvel Plan, RN)  Pain at adequate level as identified by patient: Identify patient comfort function goal;Assess for risk of opioid induced respiratory depression, including snoring/sleep apnea. Alert healthcare team of risk factors identified.;Assess pain on admission, during daily assessment and/or before any "as needed" intervention(s);Reassess pain within 30-60 minutes of any procedure/intervention, per Pain Assessment, Intervention, Reassessment (AIR) Cycle;Evaluate if patient comfort function goal is met;Evaluate patient's satisfaction with pain management progress;Offer non-pharmacological pain management interventions;Consult/collaborate with Pain Service;Consult/collaborate with Physical Therapy, Occupational Therapy, and/or Speech Therapy;Include patient/patient care companion in decisions related to pain management as needed     Problem: Compromised Tissue integrity  Goal: Damaged tissue is healing and protected  Outcome: Progressing  Flowsheets (Taken 05/17/2018 0050 by Marvel Plan, RN)  Damaged tissue is healing and protected : Monitor/assess Braden scale every shift;Provide wound care per wound care algorithm;Reposition patient every 2 hours and as needed unless able to reposition self;Increase activity as tolerated/progressive mobility;Relieve pressure to bony prominences for patients at moderate and high risk;Keep intact skin clean and dry;Avoid shearing injuries;Use bath wipes, not soap and water, for daily bathing;Use incontinence wipes for cleaning urine, stool and caustic drainage. Foley care as needed;Monitor external devices/tubes for correct placement to prevent pressure, friction and shearing;Encourage use of lotion/moisturizer on skin;Monitor patient's hygiene practices;Consult/collaborate with wound care nurse;Utilize specialty bed;Consider placing  an indwelling catheter if incontinence interferes with healing of stage 3 or 4 pressure injury  Goal: Nutritional status is improving  Outcome: Progressing  Flowsheets (Taken 05/03/2018 2000 by Dennison Mascot, RN)  Nutritional status is improving: Encourage patient to take dietary supplement(s) as ordered;Collaborate with Clinical Nutritionist;Include patient/patient care companion in decisions related to nutrition     Problem: Altered GI Function  Goal: Mobility/Activity is maintained at optimal level for patient  Outcome: Progressing  Flowsheets (Taken 05/04/2018 1455 by Ardelle Lesches, RN)  Mobility/activity is maintained at optimal level for patient: Consult/collaborate with Physical Therapy and/or Occupational Therapy;Assess for changes in respiratory status, level of consciousness and/or development of fatigue;Reposition patient every 2 hours and as needed unless able to reposition self;Plan activities to conserve energy, plan rest periods;Encourage independent activity per ability;Perform active/passive ROM;Maintain proper body alignment;Increase mobility as tolerated/progressive mobility     Problem: Compromised Hemodynamic Status  Goal: Vital signs and fluid balance maintained/improved  Outcome: Progressing  Flowsheets (Taken 05/16/2018 0410 by Berdie Ogren, RN)  Vital signs and fluid balance are maintained/improved: Position patient for maximum circulation/cardiac output;Monitor/assess vitals and hemodynamic parameters with position changes     Problem: Impaired Mobility  Goal: Mobility/Activity is maintained at optimal level for patient  Outcome: Progressing  Flowsheets (Taken 05/04/2018 1455 by Ardelle Lesches, RN)  Mobility/activity is maintained at optimal level for patient: Consult/collaborate with Physical Therapy and/or Occupational Therapy;Assess for changes in respiratory status, level of consciousness and/or development of fatigue;Reposition patient every 2 hours and as needed unless able to  reposition self;Plan activities to conserve energy, plan rest periods;Encourage independent activity per ability;Perform active/passive ROM;Maintain proper body alignment;Increase mobility as tolerated/progressive mobility
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
## 2018-05-19 NOTE — Progress Notes (Signed)
Bedside RN informed of transfer. Sheriff at bedside will transport patient.    Report: [phone]       05/19/18 0946   Discharge Disposition   Patient preference/choice provided? Yes   Physical Discharge Disposition Jail   CM Interventions   Follow up appointment scheduled? Yes   Multidisciplinary rounds/family meeting before d/c? Yes   Medicare Checklist   Is this a Medicare patient? No       Flavia Shipper, RN, BSN  Case Manager  [facility]  [phone]  on [place] call [phone]

## 2018-05-19 NOTE — Progress Notes (Signed)
Met with patient to provide a general overview of discharge instructions. Pt educated that he will follow up in the trauma clinic to have his abdominal drains removed and the neurosurgery clinic to be cleared from his spinal precautions/braces. I scheduled these appointments but could not provided the dates/times to the patient per jail policy. Pt asked about discharge medications and what would happen with meds and appointments if he posted bail and was no longer incarcerated. I informed patient that I have never dealt with that situation, but encouraged him to call the [facility] and touch base if he is released from jail. All questions answered. Also faxed completed ROI forms to medical records for records to be sent to the pt's mother and lawyer.      Riley Kill, RN, BSN, TCRN  Patient Care Navigator, Trauma Acute Care Surgery  [phone]

## 2018-05-21 DIAGNOSIS — S81011A Laceration without foreign body, right knee, initial encounter: Secondary | ICD-10-CM | POA: Diagnosis present

## 2018-05-22 ENCOUNTER — Telehealth (INDEPENDENT_AMBULATORY_CARE_PROVIDER_SITE_OTHER): Payer: Self-pay

## 2018-05-22 NOTE — Telephone Encounter (Signed)
Pt called asking about pain medication and to make follow-up appointment. Pt given prescription for pain medication in the hospital. Confirmed pt's apppointment for Monday 05/25/18 @  2:45pm.

## 2018-05-25 ENCOUNTER — Encounter (INDEPENDENT_AMBULATORY_CARE_PROVIDER_SITE_OTHER): Payer: Self-pay | Admitting: Surgery

## 2018-05-25 ENCOUNTER — Ambulatory Visit (INDEPENDENT_AMBULATORY_CARE_PROVIDER_SITE_OTHER): Payer: No Typology Code available for payment source | Admitting: Surgery

## 2018-05-25 VITALS — BP 106/68 | HR 72 | Temp 98.4°F | Ht 69.0 in | Wt 140.2 lb

## 2018-05-25 DIAGNOSIS — S36899D Unspecified injury of other intra-abdominal organs, subsequent encounter: Secondary | ICD-10-CM

## 2018-05-25 DIAGNOSIS — S36113D Laceration of liver, unspecified degree, subsequent encounter: Secondary | ICD-10-CM

## 2018-05-25 DIAGNOSIS — Z9889 Other specified postprocedural states: Secondary | ICD-10-CM

## 2018-05-25 DIAGNOSIS — G8911 Acute pain due to trauma: Secondary | ICD-10-CM

## 2018-05-25 MED ORDER — TRAMADOL HCL 50 MG PO TABS
50.00 mg | ORAL_TABLET | Freq: Two times a day (BID) | ORAL | 0 refills | Status: AC | PRN
Start: 2018-05-25 — End: 2018-06-06

## 2018-05-25 MED ORDER — TRAMADOL HCL 50 MG PO TABS
50.00 mg | ORAL_TABLET | Freq: Four times a day (QID) | ORAL | 0 refills | Status: DC | PRN
Start: 2018-05-25 — End: 2018-05-25
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
## 2018-05-25 NOTE — Patient Instructions (Signed)
Please return to clinic in 1 month to have your G and J tubes removed.   Avoid pulling/tugging of the tubes.  You may shower.

## 2018-06-10 ENCOUNTER — Telehealth (INDEPENDENT_AMBULATORY_CARE_PROVIDER_SITE_OTHER): Payer: Self-pay

## 2018-06-11 ENCOUNTER — Ambulatory Visit (INDEPENDENT_AMBULATORY_CARE_PROVIDER_SITE_OTHER): Payer: No Typology Code available for payment source | Admitting: Nurse Practitioner

## 2018-06-11 ENCOUNTER — Telehealth (INDEPENDENT_AMBULATORY_CARE_PROVIDER_SITE_OTHER): Payer: Self-pay

## 2018-06-11 ENCOUNTER — Other Ambulatory Visit: Payer: Self-pay

## 2018-06-11 ENCOUNTER — Other Ambulatory Visit: Payer: Self-pay | Admitting: Nurse Practitioner

## 2018-06-11 ENCOUNTER — Ambulatory Visit
Admission: RE | Admit: 2018-06-11 | Discharge: 2018-06-11 | Disposition: A | Discharge status: Home / Self Care | Payer: Self-pay | Source: Ambulatory Visit

## 2018-06-11 ENCOUNTER — Encounter (INDEPENDENT_AMBULATORY_CARE_PROVIDER_SITE_OTHER): Payer: Self-pay | Admitting: Nurse Practitioner

## 2018-06-11 VITALS — BP 103/71 | HR 90 | Resp 15 | Ht 69.0 in | Wt 138.4 lb

## 2018-06-11 DIAGNOSIS — S12591D Other nondisplaced fracture of sixth cervical vertebra, subsequent encounter for fracture with routine healing: Secondary | ICD-10-CM

## 2018-06-11 DIAGNOSIS — S32009D Unspecified fracture of unspecified lumbar vertebra, subsequent encounter for fracture with routine healing: Secondary | ICD-10-CM

## 2018-06-11 DIAGNOSIS — S22080D Wedge compression fracture of T11-T12 vertebra, subsequent encounter for fracture with routine healing: Secondary | ICD-10-CM
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
## 2018-06-11 NOTE — Telephone Encounter (Signed)
Pt's mother called to follow-up regarding refill for tramadol and concerns about his g-tube site. She stated it's red and has drainage. Denies fever, pain, and increasing redness/streaking. Told pt skin can become irritated at tube sites, especially since pt is also wearing a back brace. She can use gauze to help protect the skin around the site and call us if drainage becomes green, white, or pt develops a fever. Willene Hatchet, NP about prescription refill, stated she will check.
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
## 2018-06-22 ENCOUNTER — Ambulatory Visit (INDEPENDENT_AMBULATORY_CARE_PROVIDER_SITE_OTHER): Payer: No Typology Code available for payment source | Admitting: Surgery

## 2018-06-22 ENCOUNTER — Encounter (INDEPENDENT_AMBULATORY_CARE_PROVIDER_SITE_OTHER): Payer: Self-pay

## 2018-06-22 VITALS — BP 103/68 | HR 94 | Temp 98.4°F | Ht 69.0 in | Wt 139.2 lb

## 2018-06-22 DIAGNOSIS — Z9889 Other specified postprocedural states: Secondary | ICD-10-CM
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
## 2018-06-22 NOTE — Patient Instructions (Signed)
Please call this office if your drain sites are still open or draining in 2 weeks.

## 2018-06-29 ENCOUNTER — Encounter (INDEPENDENT_AMBULATORY_CARE_PROVIDER_SITE_OTHER): Payer: Self-pay | Admitting: Nurse Practitioner

## 2019-01-20 LAB — CHLAMYDIA/GC NAA, CONFIRMATION
CHLAMYDIA TRACHOMATIS, NAA: NEGATIVE
CHLAMYDIA TRACHOMATIS, NAA: NEGATIVE
Neisseria gonorrhoeae, NAA: NEGATIVE
Neisseria gonorrhoeae, NAA: NEGATIVE

## 2019-01-20 LAB — HIV-1/2 AG/AB 4TH GEN. W/ REFLEX
HIV Screen 4th Generation wRfx: NONREACTIVE
HIV Screen 4th Generation wRfx: NONREACTIVE

## 2019-01-20 LAB — RPR, RFX QN RPR/CONFIRM TP: RPR: NONREACTIVE

## 2019-01-20 LAB — RPR: RPR: NONREACTIVE

## 2019-02-01 ENCOUNTER — Telehealth (INDEPENDENT_AMBULATORY_CARE_PROVIDER_SITE_OTHER): Payer: Self-pay | Admitting: Family Medicine

## 2019-02-01 NOTE — Telephone Encounter (Signed)
I'm assuming for COVID.  Just have him schedule a virtual visit and then we'll provide an order for him.

## 2019-02-01 NOTE — Telephone Encounter (Signed)
Pt is requesting bloodwork to check IGG levels. Please advise.

## 2019-02-02 ENCOUNTER — Telehealth (INDEPENDENT_AMBULATORY_CARE_PROVIDER_SITE_OTHER): Payer: No Typology Code available for payment source | Admitting: Family Medicine

## 2019-02-02 ENCOUNTER — Encounter (INDEPENDENT_AMBULATORY_CARE_PROVIDER_SITE_OTHER): Payer: Self-pay | Admitting: Family Medicine

## 2019-02-02 DIAGNOSIS — Z202 Contact with and (suspected) exposure to infections with a predominantly sexual mode of transmission: Secondary | ICD-10-CM

## 2019-02-02 DIAGNOSIS — F32A Depression, unspecified: Secondary | ICD-10-CM | POA: Insufficient documentation

## 2019-02-02 NOTE — Telephone Encounter (Signed)
Patient scheduled for VV

## 2019-02-02 NOTE — Progress Notes (Signed)
[facility] [place] [facility]                       Date of Virtual Visit: 02/02/2019 4:54 PM        Patient ID: Jorge [name] is a 23 y.o. male.  Attending Physician: Arvilla Meres, MD       Telemedicine Eligibility:    State Location:  [x]  [place]  []  [place]  []  [place] []  [place]  []  Other:    Physical Location:  [x]  Home  []         []        []          []  Other:    Patient Identity Verification:  [x]  State Issued ID  []  Insurance Eligibility Check  []  Other:    Physical Address Verification: (for 911)  [x]  Yes  []  No    Personal identity shared with patient:  [x]  Yes  []  No    Education on nature of video visit shared with patient:  [x]  Yes  []  No    Emergency plan agreed upon with patient:  [x]  Yes  []  No    If the patient had not had this virtual visit, what would they have done?  []         []         []        []          []  Other:    Visit terminated since not appropriate for virtual care:  [x]  N/A  []  Reason:         Chief Complaint:    Chief Complaint   Patient presents with    std check     denies discharge,pain                HPI:    23 year old male here to request STD testing.  Patient has been active with a new male partner since February 2020.  Patient states that he does not use condoms.  He did have an episode of a vesicular rash on the head of the penis about 2 months ago.  Symptoms lasted for about 2 weeks and resolved on their own.  Patient also did have a brief period of dysuria that has since resolved.  Patient denies any fevers, chills, testicular lumps, or penile discharge.            Problem List:    Patient Active Problem List   Diagnosis    Attention deficit hyperactivity disorder, combined type    Depressive disorder             Current Meds:    Outpatient Medications Marked as Taking for the 02/02/19 encounter (Telemedicine Visit) with Arvilla Meres, MD   Medication Sig Dispense Refill    amphetamine-dextroamphetamine (ADDERALL XR)  15 MG 24 hr capsule 1 capsule every 24 hours            Allergies:    No Known Allergies          Past Surgical History:    Past Surgical History:   Procedure Laterality Date    ADENOIDECTOMY      at age 66    LAPAROTOMY, ABDOMINAL EXPLORATION  04/30/2018    TONSILLECTOMY      at age 32           Family History:    History reviewed. No pertinent family history.  Social History:    Social History     Tobacco Use    Smoking status: Never Smoker    Smokeless tobacco: Never Used   Substance Use Topics    Alcohol use: Not Currently     Frequency: 2-4 times a month    Drug use: Not Currently     Types: Marijuana          The following sections were reviewed this encounter by the provider:   Tobacco   Allergies   Meds   Problems   Med Hx   Surg Hx   Fam Hx              Vital Signs:    There were no vitals taken for this visit.         ROS:    Review of Systems           Physical Exam:    Physical Exam   GENERAL APPEARANCE: alert, in no acute distress, pleasant, well nourished.   HEAD: normal appearance  EYES: no discharge  EARS: normal hearing  NECK/THYROID: appearance -supple  PSYCH: appropriate affect, appropriate mood, normal speech, normal attention        Assessment:    1. Exposure to STD  - Chlamydia Gonorrhoeae NAA; Future  - RPR; Future  - HIV-1/2 Ag/Ab 4th Gen. w/ Reflex; Future  - HSV 1-2 IGG Herpeselect Antibody; Future            Plan:      Std testing as noted          Follow-up:    No follow-ups on file.         Gregary Blackard Sloan Leiter, MD

## 2019-03-10 LAB — RPR: RPR: NONREACTIVE

## 2019-03-10 LAB — HSV TYPE 1 AND 2 ANTIBODY IGG: HSV 2 IgG Type-Specific Antibody: 3.07 index — ABNORMAL HIGH (ref 0.00–0.90)

## 2019-03-10 LAB — HIV-1/2 AG/AB 4TH GEN. W/ REFLEX: HIV Screen 4th Generation wRfx: NONREACTIVE

## 2019-03-10 LAB — HSV TYPE 1 AND 2 IGG: HSV 1 IgG Type-Specific AB: 1.19 index — ABNORMAL HIGH (ref 0.00–0.90)

## 2019-03-10 LAB — CHLAMYDIA GONORRHOEAE NAA
CHLAMYDIA TRACHOMATIS, NAA: NEGATIVE
Neisseria gonorrhoeae, NAA: NEGATIVE

## 2019-03-10 LAB — REFLEX - HSV-2 IGG SUPPLEMENTAL TEST: HSV-2 IgG Supplemental Test: POSITIVE — AB

## 2019-03-12 ENCOUNTER — Telehealth (INDEPENDENT_AMBULATORY_CARE_PROVIDER_SITE_OTHER): Payer: Self-pay | Admitting: Family Medicine

## 2019-03-12 NOTE — Telephone Encounter (Signed)
Pt returned Dr. Karyl Kinnier call from 03/11/19 to discuss lab results from visit on 02/02/19. Advised that Dr. Myrtice Lauth is out of the office and if the clinical staff can't assist, another doctor might be able to call him. He said he would prefer to wait until Dr. Myrtice Lauth returns. kjp

## 2019-06-08 ENCOUNTER — Telehealth (INDEPENDENT_AMBULATORY_CARE_PROVIDER_SITE_OTHER): Payer: Self-pay | Admitting: Family Medicine

## 2019-06-08 NOTE — Telephone Encounter (Signed)
I have very few vaccination records.  If he can't get records from his childhood pediatrician.  It may be better to run some titers to see which vaccines he would still need, because otherwise it would be several.  Is this for general health or for school/work?

## 2019-06-08 NOTE — Telephone Encounter (Signed)
Pt will check w/peds and will call us.

## 2019-06-08 NOTE — Telephone Encounter (Signed)
Dr Myrtice Lauth, I entered vaccines that were on VIIS printed 06/08/2019. There are no other immunization documents in chart, pls see message from [place] and advise

## 2019-06-08 NOTE — Telephone Encounter (Signed)
Ok, please e-mail and we'll wait to hear back.  If he wants to get another MMR and varicella that is fine as well

## 2019-06-08 NOTE — Telephone Encounter (Signed)
Pt called requesting to schedule MMR and any other vaccines he is due for. Is pt due for this and other vaccines? Please advise.

## 2019-06-08 NOTE — Telephone Encounter (Signed)
Spoke w/pt and he need MMR and Varicella Vaccine records for his employer. He has requested that I email him his immunization record and he will touch base with pediatrician.

## 2019-06-14 ENCOUNTER — Encounter (INDEPENDENT_AMBULATORY_CARE_PROVIDER_SITE_OTHER): Payer: Self-pay

## 2019-06-15 ENCOUNTER — Encounter (INDEPENDENT_AMBULATORY_CARE_PROVIDER_SITE_OTHER): Payer: Self-pay

## 2019-07-05 ENCOUNTER — Encounter (INDEPENDENT_AMBULATORY_CARE_PROVIDER_SITE_OTHER): Payer: Self-pay | Admitting: Family Medicine

## 2019-07-06 ENCOUNTER — Telehealth (INDEPENDENT_AMBULATORY_CARE_PROVIDER_SITE_OTHER): Payer: No Typology Code available for payment source | Admitting: Family Medicine

## 2019-07-06 ENCOUNTER — Encounter (INDEPENDENT_AMBULATORY_CARE_PROVIDER_SITE_OTHER): Payer: Self-pay | Admitting: Family Medicine

## 2019-07-06 DIAGNOSIS — Z202 Contact with and (suspected) exposure to infections with a predominantly sexual mode of transmission: Secondary | ICD-10-CM

## 2019-07-06 DIAGNOSIS — F902 Attention-deficit hyperactivity disorder, combined type: Secondary | ICD-10-CM

## 2019-07-06 MED ORDER — AMPHETAMINE-DEXTROAMPHET ER 15 MG PO CP24
15.00 mg | ORAL_CAPSULE | ORAL | 0 refills | Status: DC
Start: 2019-07-06 — End: 2020-02-10
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
## 2019-07-13 LAB — INTERPRETATION:

## 2019-07-13 LAB — HEPATITIS C VIRUS (HCV) ANTIBODY WITH REFLEX TO QUALITATIVE: HCV AB: <0.1 s/co ratio (ref 0.0–0.9)

## 2019-07-13 LAB — RPR: RPR: NONREACTIVE

## 2019-07-13 LAB — HIV-1/2 AG/AB 4TH GEN. W/ REFLEX: HIV Screen 4th Generation wRfx: NONREACTIVE

## 2019-07-13 LAB — CHLAMYDIA GONORRHOEAE NAA
CHLAMYDIA TRACHOMATIS, NAA: NEGATIVE
Neisseria gonorrhoeae, NAA: NEGATIVE

## 2019-07-14 ENCOUNTER — Telehealth (INDEPENDENT_AMBULATORY_CARE_PROVIDER_SITE_OTHER): Payer: Self-pay

## 2019-07-14 NOTE — Telephone Encounter (Signed)
Pt left a voicemail inquiring about lab results.     Spoke w/pt and went over his lab results. Pt verbalized understanding.

## 2019-10-15 ENCOUNTER — Encounter (INDEPENDENT_AMBULATORY_CARE_PROVIDER_SITE_OTHER): Payer: Self-pay | Admitting: Family Medicine

## 2020-02-10 ENCOUNTER — Telehealth (INDEPENDENT_AMBULATORY_CARE_PROVIDER_SITE_OTHER): Payer: BLUE CROSS/BLUE SHIELD | Admitting: Family Medicine

## 2020-02-10 ENCOUNTER — Encounter (INDEPENDENT_AMBULATORY_CARE_PROVIDER_SITE_OTHER): Payer: Self-pay | Admitting: Family Medicine

## 2020-02-10 DIAGNOSIS — M545 Low back pain, unspecified: Secondary | ICD-10-CM

## 2020-02-10 DIAGNOSIS — G8929 Other chronic pain: Secondary | ICD-10-CM

## 2020-02-10 DIAGNOSIS — F902 Attention-deficit hyperactivity disorder, combined type: Secondary | ICD-10-CM

## 2020-02-10 DIAGNOSIS — A6001 Herpesviral infection of penis: Secondary | ICD-10-CM

## 2020-02-10 MED ORDER — AMPHETAMINE-DEXTROAMPHET ER 15 MG PO CP24
15.00 mg | ORAL_CAPSULE | ORAL | 0 refills | Status: DC
Start: 2020-02-10 — End: 2020-10-16

## 2020-02-10 MED ORDER — AMPHETAMINE-DEXTROAMPHET ER 15 MG PO CP24
15.00 mg | ORAL_CAPSULE | ORAL | 0 refills | Status: DC
Start: 2020-02-10 — End: 2020-05-29

## 2020-02-10 MED ORDER — MELOXICAM 15 MG PO TABS
15.00 mg | ORAL_TABLET | Freq: Every day | ORAL | 0 refills | Status: AC | PRN
Start: 2020-02-10 — End: 2020-03-11

## 2020-02-10 MED ORDER — VALACYCLOVIR HCL 500 MG PO TABS
ORAL_TABLET | ORAL | 1 refills | Status: DC
Start: 2020-02-10 — End: 2020-10-16
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
## 2020-03-10 ENCOUNTER — Other Ambulatory Visit (INDEPENDENT_AMBULATORY_CARE_PROVIDER_SITE_OTHER): Payer: Self-pay | Admitting: Family Medicine

## 2020-03-10 DIAGNOSIS — G8929 Other chronic pain: Secondary | ICD-10-CM

## 2020-05-29 ENCOUNTER — Telehealth (INDEPENDENT_AMBULATORY_CARE_PROVIDER_SITE_OTHER): Payer: Self-pay

## 2020-05-29 DIAGNOSIS — F902 Attention-deficit hyperactivity disorder, combined type: Secondary | ICD-10-CM

## 2020-05-29 MED ORDER — AMPHETAMINE-DEXTROAMPHET ER 15 MG PO CP24
15.00 mg | ORAL_CAPSULE | ORAL | 0 refills | Status: DC
Start: 2020-05-29 — End: 2020-07-13

## 2020-05-29 NOTE — Telephone Encounter (Signed)
Pt has been notified.

## 2020-05-29 NOTE — Telephone Encounter (Signed)
Rx sent to pharmacy   

## 2020-05-29 NOTE — Addendum Note (Signed)
Addended by: Lorenza Evangelist SOO on: 05/29/2020 12:05 PM     Modules accepted: Orders

## 2020-05-29 NOTE — Telephone Encounter (Signed)
At 11:41am, patient left a voicemail requesting refill for Adderall XR 15mg .     Pt had a CC f/up on 02/10/2020 and advised to f/up around 08/12/2020.    Pt [facility] [place] He will call back next month for the rest of the refills.

## 2020-07-13 ENCOUNTER — Telehealth (INDEPENDENT_AMBULATORY_CARE_PROVIDER_SITE_OTHER): Payer: Self-pay

## 2020-07-13 DIAGNOSIS — F902 Attention-deficit hyperactivity disorder, combined type: Secondary | ICD-10-CM

## 2020-07-13 MED ORDER — AMPHETAMINE-DEXTROAMPHET ER 15 MG PO CP24
15.0000 mg | ORAL_CAPSULE | ORAL | 0 refills | Status: DC
Start: 2020-07-13 — End: 2020-10-13

## 2020-07-13 NOTE — Telephone Encounter (Signed)
At 11:31am, patient left a voicemail requesting a refill for Adderall XR.     Pt had a CC f/up on 02/10/2020 and advised to f/up around 08/12/2020.    Pt requested to have [facility] [place]([facility] to chart). He will call back next month for the rest of the refills.

## 2020-07-13 NOTE — Telephone Encounter (Signed)
Pt. Called back stating [facility] would [facility] [address] [facility] [place] [phone].

## 2020-07-13 NOTE — Addendum Note (Signed)
Addended by: Nancye Grumbine SOO on: 09/03/2019 03:52 PM     Modules accepted: Orders

## 2020-07-13 NOTE — Telephone Encounter (Signed)
Rx sent to pharmacy   

## 2020-10-13 ENCOUNTER — Telehealth (INDEPENDENT_AMBULATORY_CARE_PROVIDER_SITE_OTHER): Payer: Self-pay | Admitting: Family Medicine

## 2020-10-13 DIAGNOSIS — F902 Attention-deficit hyperactivity disorder, combined type: Secondary | ICD-10-CM

## 2020-10-13 MED ORDER — AMPHETAMINE-DEXTROAMPHET ER 15 MG PO CP24
15.0000 mg | ORAL_CAPSULE | ORAL | 0 refills | Status: DC
Start: 2020-10-13 — End: 2020-10-16

## 2020-10-13 NOTE — Telephone Encounter (Signed)
Temp refills sent to pharmacy

## 2020-10-13 NOTE — Telephone Encounter (Signed)
STP and he is informed

## 2020-10-13 NOTE — Telephone Encounter (Signed)
Pls let patient know that a temp refill has been sent to pharmacy - Thank you

## 2020-10-13 NOTE — Telephone Encounter (Signed)
Pt scheduled Med check for 10/16/20- Can pt have temp refill of Ad[facility]all XR [facility] to last him through weekend? Thank you

## 2020-10-13 NOTE — Telephone Encounter (Signed)
Pt last CC was 02/10/2020 he is over due for a f/up. He is scheduled for a f/up on 10/16/2020. He is requesting to get a temp refill of Adderall XR 15mg  to get him through the weekend.

## 2020-10-16 ENCOUNTER — Telehealth (INDEPENDENT_AMBULATORY_CARE_PROVIDER_SITE_OTHER): Payer: BLUE CROSS/BLUE SHIELD | Admitting: Family Medicine

## 2020-10-16 ENCOUNTER — Encounter (INDEPENDENT_AMBULATORY_CARE_PROVIDER_SITE_OTHER): Payer: Self-pay | Admitting: Family Medicine

## 2020-10-16 DIAGNOSIS — F32A Depression, unspecified: Secondary | ICD-10-CM

## 2020-10-16 DIAGNOSIS — F902 Attention-deficit hyperactivity disorder, combined type: Secondary | ICD-10-CM

## 2020-10-16 DIAGNOSIS — F329 Major depressive disorder, single episode, unspecified: Secondary | ICD-10-CM

## 2020-10-16 DIAGNOSIS — A6001 Herpesviral infection of penis: Secondary | ICD-10-CM

## 2020-10-16 MED ORDER — VALACYCLOVIR HCL 500 MG PO TABS
ORAL_TABLET | ORAL | 1 refills | Status: DC
Start: 2020-10-16 — End: 2022-01-01

## 2020-10-16 MED ORDER — AMPHETAMINE-DEXTROAMPHET ER 15 MG PO CP24
15.0000 mg | ORAL_CAPSULE | ORAL | 0 refills | Status: DC
Start: 2020-10-16 — End: 2020-10-17

## 2020-10-16 MED ORDER — FLUOXETINE HCL 40 MG PO CAPS
40.0000 mg | ORAL_CAPSULE | Freq: Every day | ORAL | 1 refills | Status: DC
Start: 2020-10-16 — End: 2021-04-20
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
## 2020-10-17 ENCOUNTER — Telehealth (INDEPENDENT_AMBULATORY_CARE_PROVIDER_SITE_OTHER): Payer: Self-pay | Admitting: Family Medicine

## 2020-10-17 DIAGNOSIS — F902 Attention-deficit hyperactivity disorder, combined type: Secondary | ICD-10-CM

## 2020-10-17 MED ORDER — AMPHETAMINE-DEXTROAMPHET ER 15 MG PO CP24
15.0000 mg | ORAL_CAPSULE | ORAL | 0 refills | Status: DC
Start: 2020-10-17 — End: 2021-02-15

## 2020-10-17 NOTE — Telephone Encounter (Signed)
Pt pharmacy doesn't carry medication and [facility]     Address: [address], [place]   Store number: [phone]    Medication: amphetamine-dextroamphetamine (ADDERALL XR) 15

## 2020-10-17 NOTE — Telephone Encounter (Signed)
Resent prescription(s) to new pharmacy.

## 2020-10-17 NOTE — Telephone Encounter (Signed)
Dr. Myrtice Lauth , the Adderall XR 15mg  [facility] [address] is not available. Pt is requesting [facility] [address] (added to chart)

## 2020-10-17 NOTE — Telephone Encounter (Signed)
Pt has been notified.

## 2020-11-14 ENCOUNTER — Encounter (INDEPENDENT_AMBULATORY_CARE_PROVIDER_SITE_OTHER): Payer: BLUE CROSS/BLUE SHIELD | Admitting: Family Medicine

## 2021-02-12 ENCOUNTER — Telehealth (INDEPENDENT_AMBULATORY_CARE_PROVIDER_SITE_OTHER): Payer: Self-pay

## 2021-02-12 NOTE — Telephone Encounter (Signed)
SWP and scheduled pt for a VV appointment with Dr. Myrtice Lauth for 7/14.

## 2021-02-12 NOTE — Telephone Encounter (Signed)
At 3:02pm, patient left a message requesting a refill for Adderall XR 15mg  .     Pt had a CC appt on 10/16/2020 and is not due for a CC appt until 04/18/2021.     I called him back to confirm which pharmacy he would like the prescription sent to because there are 2 local pharmacies on file.     After speaking with patient, he mentioned wanting to increase the dose. He has been notified that an appt is needed if he would like to increase his current dose of Adderall XR.     I told patient that I will have a member of our clerical team call him to make that appt.     Please call patient. Thank you

## 2021-02-15 ENCOUNTER — Encounter (INDEPENDENT_AMBULATORY_CARE_PROVIDER_SITE_OTHER): Payer: Self-pay | Admitting: Family Medicine

## 2021-02-15 ENCOUNTER — Telehealth (INDEPENDENT_AMBULATORY_CARE_PROVIDER_SITE_OTHER): Payer: BLUE CROSS/BLUE SHIELD | Admitting: Family Medicine

## 2021-02-15 VITALS — Ht 70.0 in | Wt 155.0 lb

## 2021-02-15 DIAGNOSIS — G4700 Insomnia, unspecified: Secondary | ICD-10-CM

## 2021-02-15 DIAGNOSIS — F902 Attention-deficit hyperactivity disorder, combined type: Secondary | ICD-10-CM

## 2021-02-15 MED ORDER — AMPHETAMINE-DEXTROAMPHET ER 10 MG PO CP24
10.0000 mg | ORAL_CAPSULE | Freq: Every morning | ORAL | 0 refills | Status: DC
Start: 2021-02-15 — End: 2021-06-08
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
## 2021-04-20 ENCOUNTER — Other Ambulatory Visit (INDEPENDENT_AMBULATORY_CARE_PROVIDER_SITE_OTHER): Payer: Self-pay | Admitting: Family Medicine

## 2021-04-20 DIAGNOSIS — F32A Depression, unspecified: Secondary | ICD-10-CM

## 2021-06-08 ENCOUNTER — Encounter (INDEPENDENT_AMBULATORY_CARE_PROVIDER_SITE_OTHER): Payer: Self-pay

## 2021-06-08 ENCOUNTER — Ambulatory Visit (INDEPENDENT_AMBULATORY_CARE_PROVIDER_SITE_OTHER): Payer: BLUE CROSS/BLUE SHIELD | Admitting: Family Medicine

## 2021-06-08 VITALS — BP 102/69 | HR 90 | Temp 97.8°F | Wt 143.9 lb

## 2021-06-08 DIAGNOSIS — Z0000 Encounter for general adult medical examination without abnormal findings: Secondary | ICD-10-CM

## 2021-06-08 DIAGNOSIS — Z13220 Encounter for screening for lipoid disorders: Secondary | ICD-10-CM

## 2021-06-08 DIAGNOSIS — F902 Attention-deficit hyperactivity disorder, combined type: Secondary | ICD-10-CM

## 2021-06-08 DIAGNOSIS — M545 Low back pain, unspecified: Secondary | ICD-10-CM

## 2021-06-08 DIAGNOSIS — Z1159 Encounter for screening for other viral diseases: Secondary | ICD-10-CM

## 2021-06-08 DIAGNOSIS — Z131 Encounter for screening for diabetes mellitus: Secondary | ICD-10-CM

## 2021-06-08 DIAGNOSIS — Z202 Contact with and (suspected) exposure to infections with a predominantly sexual mode of transmission: Secondary | ICD-10-CM

## 2021-06-08 DIAGNOSIS — G8929 Other chronic pain: Secondary | ICD-10-CM

## 2021-06-08 MED ORDER — DICLOFENAC SODIUM 75 MG PO TBEC
75.0000 mg | DELAYED_RELEASE_TABLET | Freq: Two times a day (BID) | ORAL | 0 refills | Status: DC | PRN
Start: 2021-06-08 — End: 2022-01-01

## 2021-06-08 MED ORDER — AMPHETAMINE-DEXTROAMPHET ER 10 MG PO CP24
10.0000 mg | ORAL_CAPSULE | Freq: Every morning | ORAL | 0 refills | Status: DC
Start: 2021-06-08 — End: 2022-01-01

## 2021-06-08 MED ORDER — CYCLOBENZAPRINE HCL 5 MG PO TABS
5.0000 mg | ORAL_TABLET | Freq: Three times a day (TID) | ORAL | 0 refills | Status: AC | PRN
Start: 2021-06-08 — End: 2021-06-18

## 2021-06-08 NOTE — Addendum Note (Signed)
Addended by: Lorenza Evangelist SOO on: 06/08/2021 03:08 PM     Modules accepted: Orders
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
## 2021-06-11 LAB — CHLAMYDIA GONORRHOEAE NAA
CHLAMYDIA TRACHOMATIS, NAA: NEGATIVE
Neisseria gonorrhoeae, NAA: NEGATIVE

## 2021-06-11 LAB — COMPREHENSIVE METABOLIC PANEL
ALT: 15 IU/L (ref 0–44)
AST (SGOT): 41 IU/L — ABNORMAL HIGH (ref 0–40)
Albumin/Globulin Ratio: 1.8 (ref 1.2–2.2)
Albumin: 5 g/dL (ref 4.1–5.2)
Alkaline Phosphatase: 75 IU/L (ref 44–121)
BUN / Creatinine Ratio: 11 (ref 9–20)
BUN: 13 mg/dL (ref 6–20)
Bilirubin, Total: 1.6 mg/dL — ABNORMAL HIGH (ref 0.0–1.2)
CO2: 27 mmol/L (ref 20–29)
Calcium: 10 mg/dL (ref 8.7–10.2)
Chloride: 101 mmol/L (ref 96–106)
Creatinine: 1.22 mg/dL (ref 0.76–1.27)
Globulin, Total: 2.8 g/dL (ref 1.5–4.5)
Glucose: 89 mg/dL (ref 70–99)
Potassium: 4.1 mmol/L (ref 3.5–5.2)
Protein, Total: 7.8 g/dL (ref 6.0–8.5)
Sodium: 141 mmol/L (ref 134–144)
eGFR: 84 mL/min/{1.73_m2} (ref >59)

## 2021-06-11 LAB — LIPID PANEL
Cholesterol / HDL Ratio: 3 ratio (ref 0.0–5.0)
Cholesterol: 158 mg/dL (ref 100–199)
HDL: 52 mg/dL (ref >39)
LDL Chol Calculated (NIH): 86 mg/dL (ref 0–99)
Triglycerides: 110 mg/dL (ref 0–149)
VLDL Calculated: 20 mg/dL (ref 5–40)

## 2021-06-11 LAB — HIV-1/2 AG/AB 4TH GEN. W/ REFLEX: HIV Screen 4th Generation wRfx: NONREACTIVE

## 2021-06-11 LAB — HEPATITIS C ANTIBODY: HCV AB: <0.1 s/co ratio (ref 0.0–0.9)

## 2021-06-11 LAB — RPR: RPR: NONREACTIVE

## 2021-06-11 LAB — HEMOGLOBIN A1C: Hemoglobin A1C: 5.5 % (ref 4.8–5.6)

## 2021-07-10 ENCOUNTER — Other Ambulatory Visit (INDEPENDENT_AMBULATORY_CARE_PROVIDER_SITE_OTHER): Payer: Self-pay | Admitting: Family Medicine

## 2021-07-10 DIAGNOSIS — G8929 Other chronic pain: Secondary | ICD-10-CM

## 2021-07-10 DIAGNOSIS — M545 Low back pain, unspecified: Secondary | ICD-10-CM

## 2021-11-01 ENCOUNTER — Ambulatory Visit (INDEPENDENT_AMBULATORY_CARE_PROVIDER_SITE_OTHER): Payer: BLUE CROSS/BLUE SHIELD | Admitting: Foot & Ankle Surgery

## 2021-11-01 ENCOUNTER — Encounter (INDEPENDENT_AMBULATORY_CARE_PROVIDER_SITE_OTHER): Payer: Self-pay | Admitting: Foot & Ankle Surgery

## 2021-11-01 ENCOUNTER — Ambulatory Visit (INDEPENDENT_AMBULATORY_CARE_PROVIDER_SITE_OTHER): Payer: BLUE CROSS/BLUE SHIELD

## 2021-11-01 VITALS — BP 109/72 | HR 53 | Resp 18

## 2021-11-01 DIAGNOSIS — M722 Plantar fascial fibromatosis: Secondary | ICD-10-CM

## 2021-11-01 DIAGNOSIS — Q6689 Other  specified congenital deformities of feet: Secondary | ICD-10-CM

## 2021-11-01 NOTE — Progress Notes (Cosign Needed)
Podiatric Surgery Patient Visit    Patient Name: Jorge [name], Jorge [name]    Assessment:   -Patient is 26 y.o. 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      Plan:   -Clinical findings discussed with patient today.     -I have recommended the following:   -Orders for radiograph including 3 views of the right foot and left foot standing as well as calcaneal axial and 3 views of the bilateral ankle standing were given today. The patient will proceed to [facility] to get this imaging done. He has seen other providers in the past who have recommended surgical intervention for him. He would like to start discussing this moving forward given the amount of pain and limitation of activity he has. We will set up a video visit to discuss a specific surgical plan and how to move forward once the imaging has been complete.     -Patient instructed to call with any questions, concerns, or worsening of symptoms      Subjective:     Chief Complaint:   Chief Complaint   Patient presents with    Foot Pain     Left foot pain        HPI:  Jorge [name] is a 26 y.o. male who presents today for evaluation of bilateral foot pain.    The patient reports a history of flat feet bilaterally since childhood. He states that at one point while playing basketball, his entire left foot twisted underneath and a "bone popped out." Since, he has noticed that his left foot has been flatter than the right. He also thinks he has a bone spur. He has seen other providers in the past who recommended surgical intervention and he would like to discuss this moving forward. Currently, he has pinpoint pain occasionally and limited activity. The patient has had no recent x-rays. He mentions a family history of flat feet, but none as severe as his. Of note, he tried inserts in the past without benefit.    Physical Exam:     Vitals:    11/01/21 0849   BP: 109/72   Pulse: (!) 53    Resp: 18       Gen: AAOx3, NAD, Well developed  HEENT: Normocephalic, EOMI  Heart: Regular pulse rate and rhythm  Vascular: Palpable pedal pulses b/l, CFT to all toes <3 sec. No edema. No varicosities  Neuro: SILT. Neg Tinel's sign.  Able to wiggle toes. No motor deficits.   Derm: Skin intact without wounds or rashes.  No erythema or ecchymosis.  MSK: MMS 5/5 and symmetric, No other areas of pain or tenderness. No gross deformity. ROM normal without crepitus.  Standing and gait exam: Gait is normal      Radiology:     None reviewed today.    Transcribed for Dr. Gearldine Shown, by Maximino Sarin. on 11/01/2021 at 14:47 EST. Powered by [employer].    Corine L. Ranell Patrick, DPM, FACFAS  [facility]    ------------------------------------------------------------------------------------------------------------------------------  The review of the patient's medications does not in any way constitute an endorsement, by this clinician,  of their use, dosage, indications, route, efficacy, interactions, or other clinical parameters.    If there are questions or concerns about the content of this note or information contained within the body of this dictation they should be addressed directly with the author for clarification.

## 2021-11-08 ENCOUNTER — Encounter (INDEPENDENT_AMBULATORY_CARE_PROVIDER_SITE_OTHER): Payer: Self-pay | Admitting: Foot & Ankle Surgery

## 2021-12-19 ENCOUNTER — Other Ambulatory Visit (INDEPENDENT_AMBULATORY_CARE_PROVIDER_SITE_OTHER): Payer: Self-pay | Admitting: Foot & Ankle Surgery

## 2021-12-20 ENCOUNTER — Telehealth (INDEPENDENT_AMBULATORY_CARE_PROVIDER_SITE_OTHER): Payer: Self-pay | Admitting: Family Medicine

## 2021-12-20 NOTE — Telephone Encounter (Signed)
Patient returned the call and has been scheduled for an in office visit on 12/26/21.

## 2021-12-20 NOTE — Telephone Encounter (Signed)
Called pt and wasn't able to  leave a message

## 2021-12-20 NOTE — Telephone Encounter (Signed)
At 3:19 pm, patient left a message requesting a refill for Adderall XR 10mg .    Pt had a WME on 06/08/2021 and is due for a CC f/up on or around 12/06/2021.      Please notify pt, he will need to make an in office f/up because he will need to sign the controlled contract agreement before medication can be refilled     Thank you

## 2021-12-26 ENCOUNTER — Encounter (INDEPENDENT_AMBULATORY_CARE_PROVIDER_SITE_OTHER): Payer: Self-pay

## 2021-12-26 ENCOUNTER — Ambulatory Visit (INDEPENDENT_AMBULATORY_CARE_PROVIDER_SITE_OTHER): Payer: BLUE CROSS/BLUE SHIELD | Admitting: Family Medicine

## 2022-01-01 ENCOUNTER — Encounter (INDEPENDENT_AMBULATORY_CARE_PROVIDER_SITE_OTHER): Payer: Self-pay | Admitting: Family Medicine

## 2022-01-01 ENCOUNTER — Telehealth (INDEPENDENT_AMBULATORY_CARE_PROVIDER_SITE_OTHER): Payer: Self-pay | Admitting: Family Medicine

## 2022-01-01 ENCOUNTER — Ambulatory Visit (INDEPENDENT_AMBULATORY_CARE_PROVIDER_SITE_OTHER): Payer: BLUE CROSS/BLUE SHIELD | Admitting: Family Medicine

## 2022-01-01 VITALS — BP 111/71 | HR 92 | Temp 98.0°F | Ht 70.0 in | Wt 187.3 lb

## 2022-01-01 DIAGNOSIS — F902 Attention-deficit hyperactivity disorder, combined type: Secondary | ICD-10-CM

## 2022-01-01 DIAGNOSIS — M545 Low back pain, unspecified: Secondary | ICD-10-CM

## 2022-01-01 DIAGNOSIS — G8929 Other chronic pain: Secondary | ICD-10-CM

## 2022-01-01 DIAGNOSIS — A6001 Herpesviral infection of penis: Secondary | ICD-10-CM

## 2022-01-01 DIAGNOSIS — F32A Depression, unspecified: Secondary | ICD-10-CM

## 2022-01-01 MED ORDER — VALACYCLOVIR HCL 500 MG PO TABS
ORAL_TABLET | ORAL | 1 refills | Status: DC
Start: 2022-01-01 — End: 2022-01-18

## 2022-01-01 MED ORDER — DICLOFENAC SODIUM 75 MG PO TBEC
75.0000 mg | DELAYED_RELEASE_TABLET | Freq: Two times a day (BID) | ORAL | 1 refills | Status: DC | PRN
Start: 2022-01-01 — End: 2022-01-18

## 2022-01-01 MED ORDER — AMPHETAMINE-DEXTROAMPHET ER 15 MG PO CP24
15.0000 mg | ORAL_CAPSULE | Freq: Every morning | ORAL | 0 refills | Status: DC
Start: 2022-01-01 — End: 2022-01-01

## 2022-01-01 MED ORDER — AMPHETAMINE-DEXTROAMPHET ER 20 MG PO CP24
20.0000 mg | ORAL_CAPSULE | Freq: Every day | ORAL | 0 refills | Status: AC
Start: 2022-01-01 — End: ?
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
## 2022-01-01 NOTE — Telephone Encounter (Signed)
Rx for adderall 20 sent to pharmacy.

## 2022-01-01 NOTE — Telephone Encounter (Signed)
[facility] pharmacy sent a fax Adderall XR 15mg  is on back order .     I tried to call pt on his cell phone but unable to connect no option to leave a vm to get an alt pharmacy info or if he would like a written rx

## 2022-01-01 NOTE — Telephone Encounter (Signed)
Spoke with patient he has been notified

## 2022-01-01 NOTE — Addendum Note (Signed)
Addended by: Lorenza Evangelist SOO on: 01/01/2022 04:55 PM     Modules accepted: Orders

## 2022-01-03 ENCOUNTER — Encounter (INDEPENDENT_AMBULATORY_CARE_PROVIDER_SITE_OTHER): Payer: Self-pay | Admitting: Family Medicine

## 2022-01-09 ENCOUNTER — Encounter (INDEPENDENT_AMBULATORY_CARE_PROVIDER_SITE_OTHER): Payer: Self-pay | Admitting: Foot & Ankle Surgery

## 2022-01-09 ENCOUNTER — Ambulatory Visit (INDEPENDENT_AMBULATORY_CARE_PROVIDER_SITE_OTHER): Payer: BLUE CROSS/BLUE SHIELD | Admitting: Foot & Ankle Surgery

## 2022-01-09 VITALS — BP 94/68 | HR 82 | Ht 70.0 in | Wt 187.0 lb

## 2022-01-09 DIAGNOSIS — M722 Plantar fascial fibromatosis: Secondary | ICD-10-CM

## 2022-01-09 DIAGNOSIS — Q6689 Other  specified congenital deformities of feet: Secondary | ICD-10-CM

## 2022-01-09 NOTE — Progress Notes (Signed)
Jorge [name] is a 27 year old male with pain in his bilateral feet, which have always been flat.  On the last visit, he was sent for outside radiographs, which he presents to review.  He has been told in the past that he needs surgery and would like to discuss his options for care at this time. The patient's Mother is present during the visit today.       The patient was active as a child and did not complain of feet pain.  He feels like he has one leg shorter than the other.       ROS   Denies N/V/F/C/SOB/CP     All other systems were reviewed and are negative.     The following portions of the patient's history were reviewed and updated as appropriate: allergies, current medications, past medical history, past surgical history, family history, and problem list.     Exam    Visit Vitals  BP 94/68   Pulse 82   Ht 1.778 m (5\' 10" )   Wt 84.8 kg (187 lb)   SpO2 96%   BMI 26.83 kg/m      Gen: AAOx3, NAD, Well developed   HEENT: Normocephalic, EOMI   Heart: Regular pulse rate and rhythm   Vascular: Palpable pedal pulses b/l, CFT to all toes <3 sec. No edema. No varicosities   Neuro: SILT. Neg Tinel sign.  Able to wiggle toes. No motor deficits.   Derm: Skin intact without wounds or rashes.  No erythema or ecchymosis.   MSK: MMS 5/5 and symmetric, No other areas of pain or tenderness. No gross deformity. ROM normal without crepitus.     Standing and gait exam: Gait is nromal/abducted            ASSESSMENT AND PLAN     1.  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  His left foot is worse than the right and is already showing some adaption.  His ankle and most likely his knees are not affected. The ankle is not able to get the motion it needs because of the subtalar joints.  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  Answered all of  the patient's and aunt's questions regarding recommendations and recovery time including physical therapy.  Ordered a CT scan of the feet.  I have given him the surgical coordinator's information.  I have given him a list of places that he can have the CT scan performed at.  Once the CT scans are completed, we should be able to schedule a preop appointment.       -Clinical findings discussed with patient today.     -Initial consultation with patient regarding pain in bilateral flat feet   -We discussed in detail conservative and surgical treatment options as follows:   -padding and strapping to offload the joint, injection therapy, activity modification with rest/immobilization as needed, anti-inflammatories, orthotics and pressure balancing, and importance of supportive shoe gear.     -I have recommended the following: CT scan to identify which joints are involved, eventual left-side repositional arthrodesis and cotton osteotomy and lengthening of the Achilles tendon.       -Patient instructed to call with any questions, concerns, or worsening of symptoms     1. Number and Complexity of Problems Addressed: [  Acute Complicated / Chronic with Exacerbation / 2+ Chronic Stable]     2. Data Reviewed: (need A, B, or C)   A) Independent Radiology interpretation: [Yes  / No]     B) (need 3):   New Radiology/Labs ordered: [Yes  / No]   Outside Radiology reports/Labs reviewed: [Yes  / No]   Involve Independent Historian: [Yes  / No]   External provider notes reviewed: [Yes / No]     C) Discussion with External provider: [Yes  / No]     3. Risk of Problem/Management: Moderate risk from additional diagnostic testing or treatment   (need 1)   Closed Fracture Care: [Yes / No]   Medication Prescriptions Given: [Yes / No]   Minor Surgery/Injections with Risk Factors Discussed: [Yes / No]   Major Surgery without Risk Factors Discussed: [Yes / No]     --------------------------------------------------     Level of Medical Decision  Making: Moderate - Level 4 Visit   (Need 2 of 3 categories above)     Transcribed for Dr. Gearldine Shown, by Thersa Salt on 01/09/2022 at 10:39 AM. Powered by [employer].

## 2022-01-18 ENCOUNTER — Encounter (INDEPENDENT_AMBULATORY_CARE_PROVIDER_SITE_OTHER): Payer: Self-pay | Admitting: Family Medicine

## 2022-01-18 ENCOUNTER — Ambulatory Visit (INDEPENDENT_AMBULATORY_CARE_PROVIDER_SITE_OTHER): Payer: BLUE CROSS/BLUE SHIELD | Admitting: Family Medicine

## 2022-01-18 VITALS — BP 113/77 | HR 85 | Temp 98.1°F | Ht 70.0 in | Wt 179.0 lb

## 2022-01-18 DIAGNOSIS — M79641 Pain in right hand: Secondary | ICD-10-CM
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
## 2022-01-22 ENCOUNTER — Telehealth (INDEPENDENT_AMBULATORY_CARE_PROVIDER_SITE_OTHER): Payer: Self-pay | Admitting: Family Medicine

## 2022-01-22 NOTE — Telephone Encounter (Signed)
Can you give the pt the number to [facility] and tell him I'd like him to see Dr. Davina Poke or Mauri Pole

## 2022-01-22 NOTE — Telephone Encounter (Signed)
I spoke with patient and provided him the contact number for [facility] for Dr. Davina Poke and Mauri Pole [phone]
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
## 2022-05-22 ENCOUNTER — Other Ambulatory Visit (INDEPENDENT_AMBULATORY_CARE_PROVIDER_SITE_OTHER): Payer: Self-pay | Admitting: Foot & Ankle Surgery

## 2022-05-22 DIAGNOSIS — M722 Plantar fascial fibromatosis: Secondary | ICD-10-CM

## 2022-05-22 DIAGNOSIS — Q6689 Other  specified congenital deformities of feet: Secondary | ICD-10-CM | POA: Insufficient documentation

## 2022-05-29 ENCOUNTER — Ambulatory Visit (INDEPENDENT_AMBULATORY_CARE_PROVIDER_SITE_OTHER): Payer: BLUE CROSS/BLUE SHIELD | Admitting: Foot & Ankle Surgery

## 2022-06-13 ENCOUNTER — Ambulatory Visit (INDEPENDENT_AMBULATORY_CARE_PROVIDER_SITE_OTHER): Payer: BLUE CROSS/BLUE SHIELD | Admitting: Foot & Ankle Surgery

## 2022-06-14 ENCOUNTER — Encounter (INDEPENDENT_AMBULATORY_CARE_PROVIDER_SITE_OTHER): Payer: Self-pay

## 2022-06-28 ENCOUNTER — Encounter: Admission: RE | Payer: Self-pay | Source: Ambulatory Visit

## 2022-06-28 ENCOUNTER — Ambulatory Visit
Admission: RE | Admit: 2022-06-28 | Payer: BLUE CROSS/BLUE SHIELD | Source: Ambulatory Visit | Admitting: Foot & Ankle Surgery

## 2022-06-28 DIAGNOSIS — M722 Plantar fascial fibromatosis: Secondary | ICD-10-CM | POA: Insufficient documentation

## 2022-06-28 DIAGNOSIS — Q6689 Other  specified congenital deformities of feet: Secondary | ICD-10-CM

## 2022-06-28 SURGERY — RESECTION, GASTROC OF FOOT
Anesthesia: General | Site: Foot | Laterality: Left

## 2022-07-15 ENCOUNTER — Encounter (INDEPENDENT_AMBULATORY_CARE_PROVIDER_SITE_OTHER): Payer: BLUE CROSS/BLUE SHIELD | Admitting: Foot & Ankle Surgery

## 2023-09-08 ENCOUNTER — Ambulatory Visit (INDEPENDENT_AMBULATORY_CARE_PROVIDER_SITE_OTHER): Payer: BLUE CROSS/BLUE SHIELD | Admitting: Foot & Ankle Surgery

## 2024-01-29 ENCOUNTER — Telehealth (INDEPENDENT_AMBULATORY_CARE_PROVIDER_SITE_OTHER): Admitting: Family Medicine

## 2024-01-29 ENCOUNTER — Encounter (INDEPENDENT_AMBULATORY_CARE_PROVIDER_SITE_OTHER): Payer: Self-pay | Admitting: Family Medicine

## 2024-01-29 DIAGNOSIS — A6001 Herpesviral infection of penis: Secondary | ICD-10-CM

## 2024-01-29 DIAGNOSIS — F902 Attention-deficit hyperactivity disorder, combined type: Secondary | ICD-10-CM

## 2024-01-29 MED ORDER — AMPHETAMINE-DEXTROAMPHET ER 20 MG PO CP24
20.0000 mg | ORAL_CAPSULE | Freq: Every day | ORAL | 0 refills | Status: AC
Start: 2024-01-29 — End: ?
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
